# Patient Record
Sex: Female | Born: 1965
Health system: Southern US, Community
[De-identification: ages and names within clinical notes are randomized; demographics above are authoritative.]

## PROBLEM LIST (undated history)

## (undated) DIAGNOSIS — T8859XA Other complications of anesthesia, initial encounter: Secondary | ICD-10-CM

## (undated) DIAGNOSIS — I10 Essential (primary) hypertension: Secondary | ICD-10-CM

## (undated) DIAGNOSIS — G4730 Sleep apnea, unspecified: Secondary | ICD-10-CM

## (undated) DIAGNOSIS — F419 Anxiety disorder, unspecified: Secondary | ICD-10-CM

## (undated) DIAGNOSIS — F329 Major depressive disorder, single episode, unspecified: Secondary | ICD-10-CM

## (undated) DIAGNOSIS — Z9889 Other specified postprocedural states: Secondary | ICD-10-CM

## (undated) DIAGNOSIS — I499 Cardiac arrhythmia, unspecified: Secondary | ICD-10-CM

## (undated) DIAGNOSIS — M549 Dorsalgia, unspecified: Secondary | ICD-10-CM

## (undated) DIAGNOSIS — R7303 Prediabetes: Secondary | ICD-10-CM

## (undated) DIAGNOSIS — J069 Acute upper respiratory infection, unspecified: Secondary | ICD-10-CM

## (undated) DIAGNOSIS — Z9109 Other allergy status, other than to drugs and biological substances: Secondary | ICD-10-CM

## (undated) DIAGNOSIS — F32A Depression, unspecified: Secondary | ICD-10-CM

## (undated) HISTORY — PX: TUBAL LIGATION: SHX77

---
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
## 1997-11-16 ENCOUNTER — Other Ambulatory Visit: Admission: RE | Admit: 1997-11-16 | Discharge: 1997-11-16 | Payer: Self-pay | Admitting: Obstetrics and Gynecology

## 1999-03-20 ENCOUNTER — Other Ambulatory Visit: Admission: RE | Admit: 1999-03-20 | Discharge: 1999-03-20 | Payer: Self-pay | Admitting: Obstetrics and Gynecology

## 1999-10-16 ENCOUNTER — Inpatient Hospital Stay (HOSPITAL_COMMUNITY): Admission: AD | Admit: 1999-10-16 | Discharge: 1999-10-18 | Payer: Self-pay | Admitting: Obstetrics and Gynecology

## 1999-10-16 ENCOUNTER — Encounter (INDEPENDENT_AMBULATORY_CARE_PROVIDER_SITE_OTHER): Payer: Self-pay | Admitting: Specialist

## 1999-11-22 ENCOUNTER — Other Ambulatory Visit: Admission: RE | Admit: 1999-11-22 | Discharge: 1999-11-22 | Payer: Self-pay | Admitting: Obstetrics and Gynecology

## 2001-06-17 ENCOUNTER — Other Ambulatory Visit: Admission: RE | Admit: 2001-06-17 | Discharge: 2001-06-17 | Payer: Self-pay | Admitting: Family Medicine

## 2004-05-10 ENCOUNTER — Other Ambulatory Visit: Admission: RE | Admit: 2004-05-10 | Discharge: 2004-05-10 | Payer: Self-pay | Admitting: Family Medicine

## 2006-01-22 HISTORY — PX: CHOLECYSTECTOMY: SHX55

## 2006-06-02 ENCOUNTER — Inpatient Hospital Stay (HOSPITAL_COMMUNITY): Admission: EM | Admit: 2006-06-02 | Discharge: 2006-06-04 | Payer: Self-pay | Admitting: Emergency Medicine

## 2006-06-20 ENCOUNTER — Ambulatory Visit (HOSPITAL_COMMUNITY): Admission: RE | Admit: 2006-06-20 | Discharge: 2006-06-20 | Payer: Self-pay | Admitting: Family Medicine

## 2006-07-15 ENCOUNTER — Other Ambulatory Visit: Admission: RE | Admit: 2006-07-15 | Discharge: 2006-07-15 | Payer: Self-pay | Admitting: Family Medicine

## 2006-07-30 ENCOUNTER — Encounter (INDEPENDENT_AMBULATORY_CARE_PROVIDER_SITE_OTHER): Payer: Self-pay | Admitting: *Deleted

## 2006-07-30 ENCOUNTER — Ambulatory Visit (HOSPITAL_COMMUNITY): Admission: RE | Admit: 2006-07-30 | Discharge: 2006-07-30 | Payer: Self-pay | Admitting: *Deleted

## 2007-05-14 ENCOUNTER — Emergency Department (HOSPITAL_COMMUNITY): Admission: EM | Admit: 2007-05-14 | Discharge: 2007-05-14 | Payer: Self-pay | Admitting: Emergency Medicine

## 2007-12-09 ENCOUNTER — Observation Stay (HOSPITAL_COMMUNITY): Admission: EM | Admit: 2007-12-09 | Discharge: 2007-12-10 | Payer: Self-pay | Admitting: Emergency Medicine

## 2008-01-21 ENCOUNTER — Observation Stay (HOSPITAL_COMMUNITY): Admission: AD | Admit: 2008-01-21 | Discharge: 2008-01-22 | Payer: Self-pay | Admitting: Interventional Cardiology

## 2008-04-01 ENCOUNTER — Other Ambulatory Visit: Admission: RE | Admit: 2008-04-01 | Discharge: 2008-04-01 | Payer: Self-pay | Admitting: Family Medicine

## 2008-09-20 ENCOUNTER — Encounter: Admission: RE | Admit: 2008-09-20 | Discharge: 2008-09-20 | Payer: Self-pay | Admitting: Family Medicine

## 2008-09-23 ENCOUNTER — Encounter: Admission: RE | Admit: 2008-09-23 | Discharge: 2008-09-23 | Payer: Self-pay | Admitting: Family Medicine

## 2009-11-08 IMAGING — CT CT HEART WO/W CTA ONLY W/ CA
2 of 3 series · 11 of 20 positions shown, 12 images · IV contrast (APPLIED)
Comparison: none

CLINICAL DATA: CARDIAC CTA WITH CALCIUM SCORE 01/22/2008 [DATE]

Ordering Physician: Luckner
PROTOCOL: The patient scanned on a Siemens sensations 64 slice
scanner.  Gantry rotation speed was 320 milliseconds.  Collimation
was [DATE] mm . Reconstruction overlap was [DATE] mm.   50
mg of Lopressor was administered, as well as 0.4 mg of sublingual
nitroglycerin.  Average heart rate during the scan was 44 beats
per minute.  After an initial AP and lateral topogram, 3 mm axial
slices were performed through the heart for calcium scoring.  The
patient then received 20 ml of contrast for a timing bolus with a
region of interest in the ascending aorta.  A delay of 19 seconds
was used.  The patient then had a 80 ml of contrast given for
coronary CTA.  The 3-D data set was then sent to the [facility]
workstation.  Reconstructions were done using MIP,MPR and VRT
modes.

[Series 6: soft tissue · axial · 0.63mm/px · z∈[-250,-154]mm · 3 of 33 slices shown, 4 images]
[im 1/33  vessel]
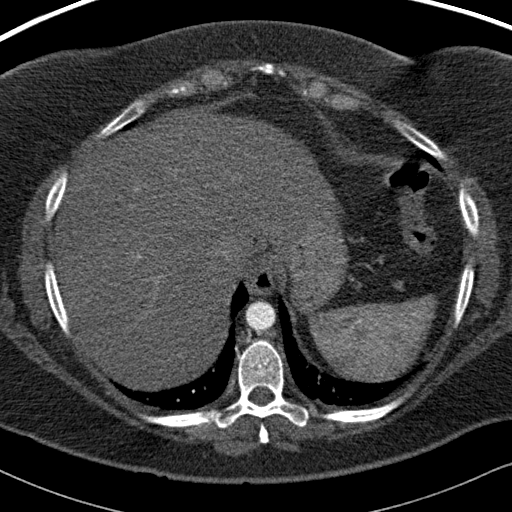
[im 1/33  lung]
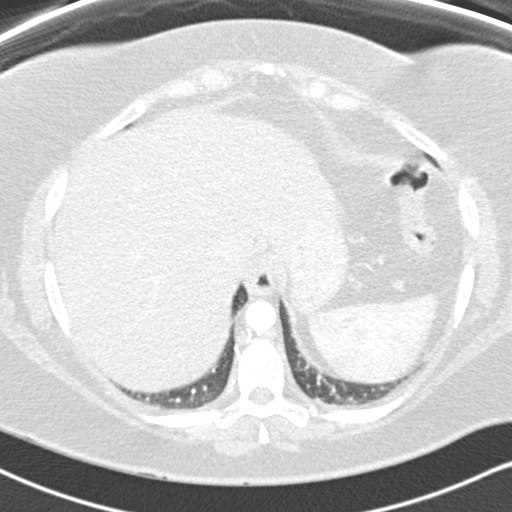
[im 17/33  vessel]
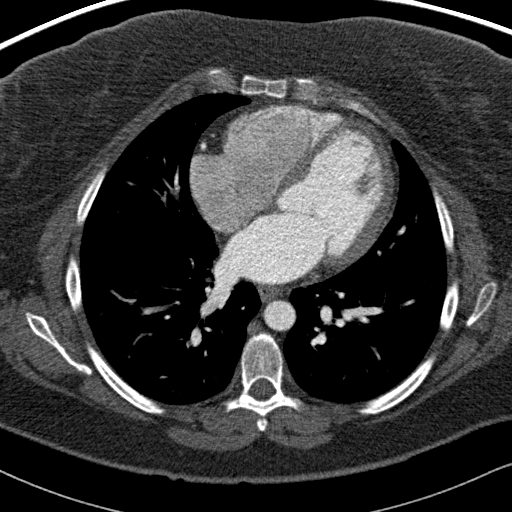
[im 33/33  vessel]
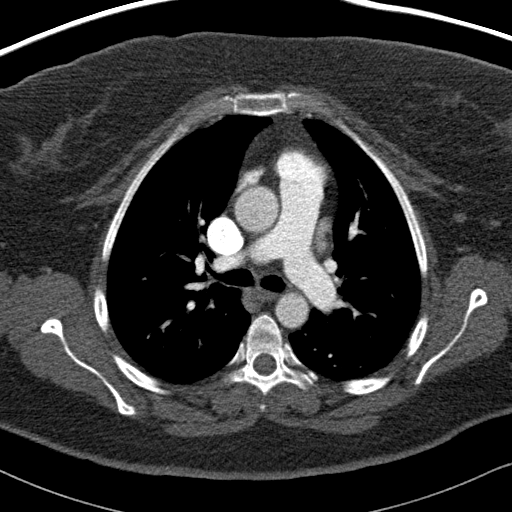

[Series 9: 70% only · axial · 0.37mm/px · z∈[-237,-166]mm · 8 of 213 slices shown]
[im 17/213  vessel]
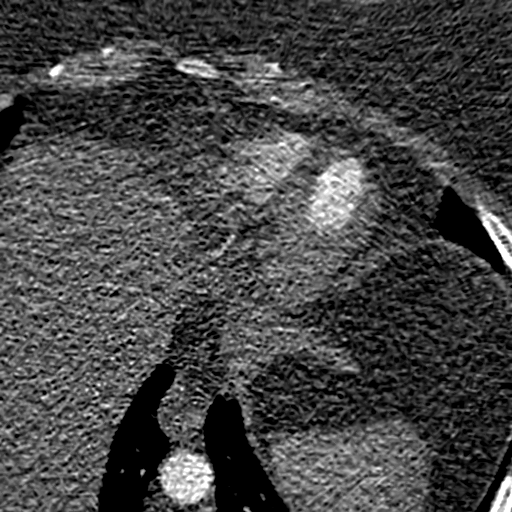
[im 49/213  vessel]
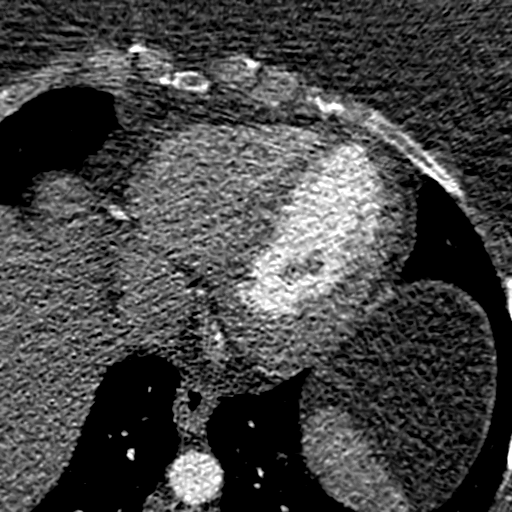
[im 66/213  vessel]
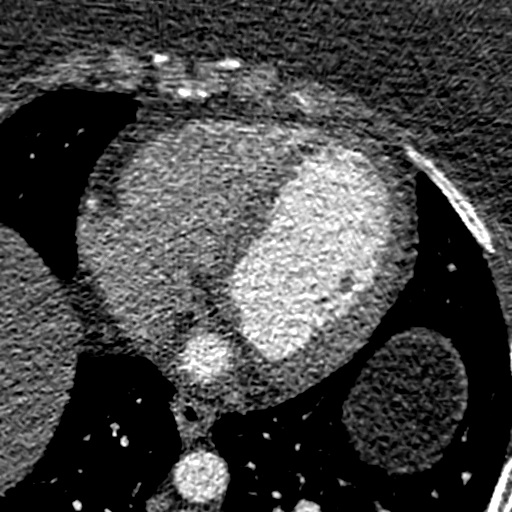
[im 98/213  vessel]
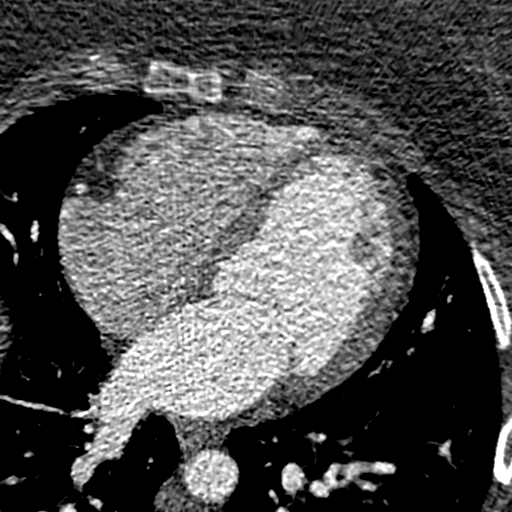
[im 115/213  vessel]
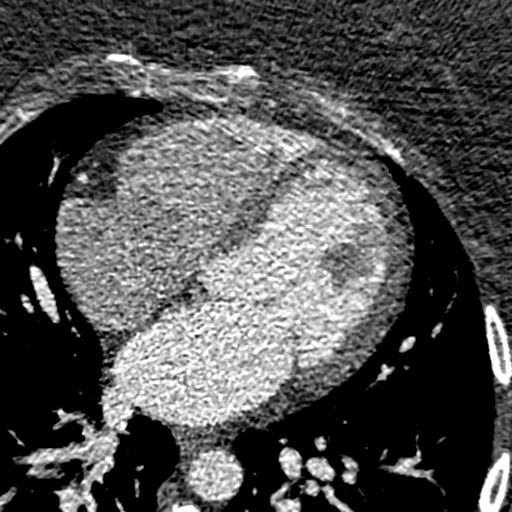
[im 147/213  vessel]
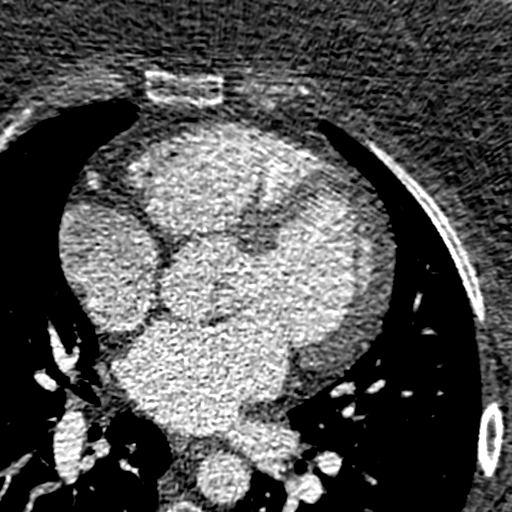
[im 164/213  vessel]
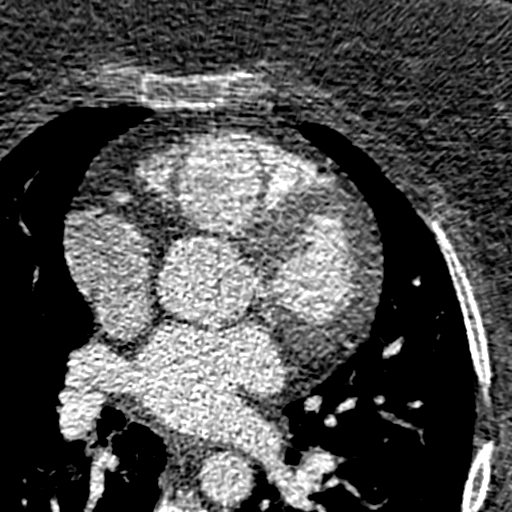
[im 196/213  vessel]
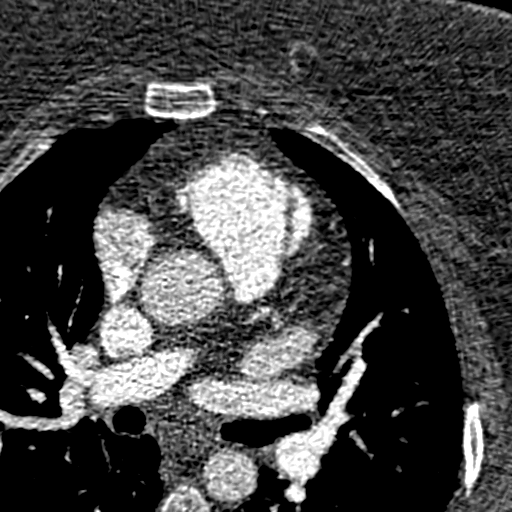

[11 of 20 positions shown; findings below may reference images not displayed]

Indications: Atypical chest pain, recurrent, previously low risk
nuclear stress test.  Risk factors include obesity.

DETAILED FINDINGS:

Quality of Study: Study is interpretable however mildly limited due
to body habitus.

Left Main: Gives rise to both the LAD and circumflex artery.  There
is no angiographically significant disease.

Left Anterior Descending: Gives rise to one large diagonal branch.
The distal segments of the artery are difficult to visualize. The
proximal diagonal segment is best seen in the 90% phase.  In the
segments visualized, there is no angiographically significant
calcification or plaquing.

Left Circumflex: There are two obtuse marginal branches identified.
The proximal portions of this vessel are widely patent without any
significant plaquing or stenosis.  The mid to distal sections of
the artery are difficult to visualize.

Right Coronary Artery: This is the dominant vessel giving rise to
the posterior descending artery.  This large caliber vessel has no
angiographically significant plaquing or stenosis.

Ventricular Function/Wall Motion: There are no wall motion
abnormalities.  Left ventricular septum measures 7 mm, posterior
wall measures 10 mm.

LV Ejection Fraction: Ejection fraction is 60-65%.

Left Atrium, Right Atrium, RV Size: The left atrium is mildly
dilated at 45 mm, the right atrium also appears mildly dilated.
The right ventricle is also mildly dilated in chamber size.  The
aortic valve appears trileaflet.  Is a opening/functioning
normally.  There is no evidence of coarctation.  There is no
evidence of aortic aneurysm in the segments visualized.  The aortic
root appears normal.

Pericardium: No pericardial effusion.

Coronary Calcium Score: Zero.  There is no coronary artery
calcification.

Aorta: Normal appearing aorta.

Other: Other noncardiac findings will be reviewed by over reading
radiologist.
IMPRESSION: 1. No angiographically significant coronary artery disease. The
proximal segment are well visualized, however the distal segments
of the LAD and circumflex artery are difficult to visualize most
likely secondary to body habitus.  Sensitivity of the study is
reduced.

 2. Normal left ventricular function with ejection fraction of  60-
65%.

3. Mildly dilated left atrium, right atrium, right ventricular
chamber size.

4. Calcium score is zero.

## 2010-06-06 NOTE — H&P (Signed)
NAME:  April Mccarty, April Mccarty NO.:  [id number]   MEDICAL RECORD NO.:  [id number]          PATIENT TYPE:  INP   LOCATION:  4733                         FACILITY:  [facility]   PHYSICIAN:  Corky Crafts, MDDATE OF BIRTH:  11-Sep-1965   DATE OF ADMISSION:  12/09/2007  DATE OF DISCHARGE:                              HISTORY & PHYSICAL   CHIEF COMPLAINT:  Chest pain.   HISTORY OF PRESENT ILLNESS:  April Mccarty is a 45 year old female with  known history of coronary artery disease.  She was admitted last year in  May 2008 with chest pain.  She had a 2-day stress test and at that time  this was normal.  She states that she had no further chest pain until  recently.  Recently, she has had intermittent substernal chest pain  radiating into her left arm and under her left breast.  Yesterday, she  stated she was lightheaded, but had no palpitations or chest pain.  She  then awoke last night with her heart racing and her heart squeezing.  She went back to sleep and awoke this morning and she was fine.  She  then went to work and developed a midsternal chest tightness with left  arm pain similar to previous pain.  While I was in the room with her,  she had a blood pressure taken and said that squeezing of the cuff made  her chest hurt more.  Initially, her chest pain was 10/10.  By the time  I saw her, her pain was 3/10 and this was without any treatment  whatsoever.  I did give her sublingual nitroglycerin at this point and  her pain subsided to a 0/10.   PAST MEDICAL HISTORY:  Obesity, seasonal allergies, and status post  bilateral tubal ligation.   SOCIAL HISTORY:  She lives in [place] with her husband.  She works as  a Scientist, physiological.  She has 2 children.  She denies any tobacco, alcohol,  or illicit drug use.   FAMILY HISTORY:  Mother is alive.  She has a heart valve problem.  Father's history is unknown.  She has a grandfather who died at age 72  from a myocardial  infarction.   ALLERGIES:  SULFA.   MEDICATIONS:  Zyrtec p.r.n.   REVIEW OF SYSTEMS:  Chest pain, palpitations, and nausea.  Review of  systems otherwise negative.   PHYSICAL EXAMINATION:  VITAL SIGNS:  Temperature 97.3, pulse 73, blood  pressure 136/91, respirations 18, and O2 saturations 100% on room air.  GENERAL:  She is in no acute distress, but is tearful.  HEENT:  Grossly normal.  No carotid or subclavian bruits.  No JVD or  thyromegaly.  Sclerae clear.  Conjunctivae normal.  Nares without  drainage.  CHEST:  Clear to auscultation bilaterally.  No reproducible chest pain.  HEART:  Regular rate and rhythm.  No gross murmur.  ABDOMEN:  Obese.  Good bowel sounds.  Nontender and nondistended.  No  mass.  No bruits.  EXTREMITIES:  No peripheral edema.  SKIN:  Warm and dry.  NEUROLOGIC:  Cranial nerves II-XII grossly intact.  PSYCH:  She is again  tearful and appears to be somewhat anxious.   Chest x-ray, mild peribronchial thickening.  Otherwise, no active  disease.  Lab studies show point of care markers negative x1, hemoglobin  12.8, hematocrit 39.1, sodium 140, potassium 4.2, BUN 16, creatinine  0.7.  EKG normal sinus rhythm, rate 63, within normal limits.  No ST  changes.   ASSESSMENT AND PLAN:  1. Chest pain, atypical.  2. Early family history of coronary artery disease.  3. Obesity.  4. Seasonal allergies.   We will cycle cardiac enzymes.  If her chest pain returns, consider a  cardiac catheterization tomorrow.  We will go ahead and place her on  Lovenox as well.  Serial EKGs.  If no recurrent chest pain, we will  discharge her to home and follow up in the office with a 2-day stress  test.  The patient was seen and examined by Dr. Everette Rank.      Guy Franco, P.A.      Corky Crafts, MD  Electronically Signed    LB/MEDQ  D:  12/09/2007  T:  12/10/2007  Job:  [id number]   cc:   Holley Bouche, M.D.  Corky Crafts, MD

## 2010-06-06 NOTE — Op Note (Signed)
NAME:  April Mccarty, April Mccarty NO.:  [id number]   MEDICAL RECORD NO.:  [id number]          PATIENT TYPE:  AMB   LOCATION:  DAY                          FACILITY:  [facility]   PHYSICIAN:  Alfonse Ras, MD   DATE OF BIRTH:  April 27, 1965   DATE OF PROCEDURE:  07/30/2006  DATE OF DISCHARGE:                               OPERATIVE REPORT   PREOPERATIVE DIAGNOSIS:  Symptomatic cholelithiasis.   POSTOPERATIVE DIAGNOSIS:  Symptomatic cholelithiasis.   PROCEDURE:  Laparoscopic cholecystectomy.   SURGEON:  Alfonse Ras, M.D.   ASSISTANT:  Leonie Man, M.D.   ANESTHESIA:  General.   DESCRIPTION:  The patient was taken to the operating room and placed in  the supine position. After adequate general anesthesia was induced using  an endotracheal tube, the abdomen was prepped and draped in a normal  sterile fashion. Using a transverse infraumbilical incision, I dissected  down to the fascia.  The fascia was opened vertically.  After entering  the peritoneum, an 0 Vicryl pursestring suture was placed around the  fascial defect.  Under direct vision, an 11 mm trocar was placed in the  subxiphoid region and two 5 mm trocars were placed in the right abdomen.  The gallbladder was identified and retracted cephalad.   Dissection at the neck of the gallbladder easily visualized the cystic  duct and a critical view was obtained.  This was a very small cystic  duct.  It was dissected and its junction with the gallbladder and common  duct were identified.  It was triply clipped and divided.  The cystic  artery was identified in a similar fashion, triply clipped and divided.  The gallbladder was taken off the gallbladder bed using Bovie  electrocautery and placed in an EndoCatch bag.  The right upper quadrant  was copiously irrigated.  Adequate hemostasis was ensured.  The  gallbladder was removed through the umbilical port in the EndoCatch bag.  The pneumoperitoneum was released.  The  infraumbilical fascial defect  was closed with the 0 Vicryl pursestring suture.  The skin incisions  were closed with subcuticular 4-0 Monocryl.  Steri-Strips and dressings  were applied.  The patient tolerated the procedure well and went to the  PACU in good condition.      Alfonse Ras, MD  Electronically Signed     KRE/MEDQ  D:  07/30/2006  T:  07/30/2006  Job:  [id number]

## 2010-06-06 NOTE — H&P (Signed)
NAME:  April Mccarty, April Mccarty NO.:  [id number]   MEDICAL RECORD NO.:  [id number]          PATIENT TYPE:  OBV   LOCATION:  2037                         FACILITY:  [facility]   PHYSICIAN:  Corky Crafts, MDDATE OF BIRTH:  [birth year]   DATE OF ADMISSION:  01/21/2008  DATE OF DISCHARGE:  01/22/2008                              HISTORY & PHYSICAL   REASON FOR ADMISSION:  Chest pain.   April Mccarty was seen back in the office on January 21, 2008,  complaining of chest pain, felt to be related to anxiety or a GI source.  She stated she had an abdominal ultrasound which was reportedly  normal.  She had been on Lexapro, as well as lorazepam and stated that  this helped initially, but she again started having chest pain.  She  described a throbbing discomfort under her left breast with associated  shortness of breath and nausea.  It was not related to exertion, meals,  or movement of the chest wall.   Dr. Eldridge Dace saw her in the office and felt that it was afer to keep her  in the hospital overnight to assure no cardiac abnormality.   PAST MEDICAL HISTORY:  1. Chest pain.  2. Obesity.  3. Allergies.  4. Asthma.  5. Gestational diabetes.  6. Tinea versicolor.  7. Status post tubal ligation.  8. Tonsillectomy.  9. Cholecystectomy.   FAMILY HISTORY:  Mom had diabetes, hypercholesterolemia, and stroke;  paternal grandfather, diabetes; maternal grandfather, hypertension;  maternal grandmother, hypertension.   SOCIAL HISTORY:  Quit smoking in 1995, denies alcohol.  She is married  and has 2 children.   ALLERGIES:  SULFACETAMIDE SODIUM.   CURRENT MEDICATIONS:  1. Zyrtec 10 mg 1 p.o. daily.  2. Sublingual nitroglycerin p.r.n. chest pain.  3. Lexapro 10 mg.  4. Lorazepam 0.5 mg 1 p.o. t.i.d. p.r.n.  5. Lotrisone cream apply to affected area twice a day.  6. Ativan p.r.n.  7. Omeprazole 20 mg one p.o. b.i.d.   PHYSICAL EXAMINATION:  VITAL SIGNS:  Weight 240, blood  pressure 170/90,  pulse 68.  GENERAL:  She is pleasant in no acute distress.  HEENT:  Normal.  JVD flat.  HEART:  Regular rate and rhythm.  Normal S1 and S2, no murmur, no rub,  no gallop, no clicks.  Heart murmur none.  LUNGS: Clear to auscultation.  No wheezing or rhonchi, or rales.  ABDOMEN: Soft, nontender, no hepatomegaly, no masses palpated.  EXTREMITIES:  No leg edema.  Peripheral pulses 2+ bilaterally.  NEUROLOGIC:  Cranial nerves intact.  MOOD:  Anxious when talking about her condition.   EKG shows normal sinus rhythm with no ST-T wave changes and essentially  has a normal EKG.   ASSESSMENT AND PLAN:  1. Chest pain.  2. Obesity.   Dr. Eldridge Dace had a long discussion with the patient and her husband  regarding the options of treating this chest pain.  He felt that it was  a low probability, this is cardiac in origin, but decided to admit the  patient to rule out myocardial infarction.  If her enzymes were  positive, we will plan for  cardiac catheterization; if they are  negative, plan CTA of the coronaries.      Guy Franco, P.A.      Corky Crafts, MD  Electronically Signed    LB/MEDQ  D:  01/22/2008  T:  01/23/2008  Job:  [id number]   cc:   Melida Quitter, M.D.

## 2010-06-06 NOTE — Discharge Summary (Signed)
NAME:  MARGARETANN, ABATE NO.:  [id number]   MEDICAL RECORD NO.:  [id number]          PATIENT TYPE:  INP   LOCATION:  3729                         FACILITY:  [facility]   PHYSICIAN:  Ellie Lunch, M.D.      DATE OF BIRTH:  [birth year]   DATE OF ADMISSION:  06/01/2006  DATE OF DISCHARGE:  06/04/2006                               DISCHARGE SUMMARY   PRIMARY CARE PHYSICIAN:  Dr. Leonides Sake.   CONSULTATIONS:  Dr. Eldridge Dace with [facility].   DISCHARGE DIAGNOSES:  1. Atypical chest pain.  The patient ruled out for an MI with serial      cardiac markers, as well as a negative Cardiolite.  2. Bradycardia.   PAST MEDICAL HISTORY:  1. Gestational diabetes.  2. Obesity.   MEDICATIONS:  Nitroglycerin 0.4 mg sublingual as needed for chest pain.   DISPOSITION AND FOLLOW UP:  The patient will follow up with Dr. Eldridge Dace  on June 26, 2006 at 1 p.m.  At that time, Dr. Eldridge Dace will also evaluate  her for bradycardia, which she had during hospital course especially  occurring at night, but the patient being asymptomatic from it.   PROCEDURES DONE:  A Cardiolite was performed while the patient was in  the hospital and was negative for left ventricle myocardial ischemia and  ejection fraction was 58%.   HISTORY OF PRESENT ILLNESS:  Ms. was a very pleasant 45 year old white  female with past medical history as indicated above who was admitted  because of a squeezing pain in her left chest associated with some  dizziness 1 day prior to admission while she was walking up a slight  hill.  She went back to the house, sat down, and the pain improved.  She  then went back to walking up the hill and the pain became more intense.  She felt like she had pain for about 12 hours continuously and went to  the walk in clinic so she could be admitted to the hospital to rule out  MI.  There was also severe shortness of breath, dizziness, and  clamminess.   HOSPITAL COURSE:  1. The patient  was admitted on a tele bed to rule her out for MI.  A      cardiology consult was obtained.  Dr. Eldridge Dace accessed the patient      .  Serial cardiac enzymes obtained were negative, also the patient      received a Cardiolite, which was negative as well.  Thus, the      patient most likely has atypical chest pain.  She will follow up      with Dr. Eldridge Dace as an outpatient on June 26, 2006.  2. Bradycardia.  The patient had an episode of bradycardia where her      heart rate would drop to 36 beats per minute and this would only      occur during night.  There was a question of whether this was      associated with obstructive sleep apnea, which can further be done      as an outpatient since the patient is obese  and does have      risk factors for obstructive sleep apnea.  Of note:  The patient      did no have any bradycardia during the day.  3. Obesity.  The patient was counseled on lifestyle management      including dietary advise and was asked to also do exercise along      with diet to lose weight.      Ellie Lunch, M.D.  Electronically Signed     BP/MEDQ  D:  06/04/2006  T:  06/04/2006  Job:  [id number]   cc:   Holley Bouche, M.D.  Corky Crafts, MD

## 2010-06-06 NOTE — H&P (Signed)
NAME:  April Mccarty, April Mccarty NO.:  [id number]   MEDICAL RECORD NO.:  [id number]          PATIENT TYPE:  INP   LOCATION:  3729                         FACILITY:  [facility]   PHYSICIAN:  Thora Lance, M.D.  DATE OF BIRTH:  January 19, 1966   DATE OF ADMISSION:  06/01/2006  DATE OF DISCHARGE:                              HISTORY & PHYSICAL   PRIMARY PHYSICIAN:  Dr. Leonides Sake.   CHIEF COMPLAINT:  Chest pain.   HISTORY OF PRESENT ILLNESS:  This is a 45 year old white female  generally in good health who this morning while walking her dog up a  slight hill had the onset of a squeezing pain in her left chest  associated with some dizziness.  She went back to the house, sat down,  and then the pain eased up.  She took some Gas-X and Pepcid.  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  During the  day she had mild discomfort in her left chest.  When she got home from  work she again walked her dog and again had pain which she described as  fairly severe in her left chest, squeezing, associated with shortness of  breath, dizziness, and some clamminess.  She got back to the house, sat  down, and the pain again eased off.  She came to the ER and was given  morphine, and the pain has resolved.  She has no known cardiac risk  factors.  Her maternal grandfather did have an MI at age 76.  She had  gestational diabetes.  She has not seen her primary physician in several  years and had lab work done.  Her cholesterol is unknown.   PAST MEDICAL HISTORY:  1. Gestational diabetes.  2. Obesity.   PAST SURGICAL HISTORY:  BTL.   ALLERGIES:  SULFA.   MEDICATIONS:  None.   FAMILY HISTORY:  Mother:  Diabetes, high cholesterol.  Father:  Unknown.  No siblings.  Maternal grandfather:  MI at age 44.  Maternal uncle:  Diabetes.  Paternal grandfather:  Diabetes.  Paternal uncle:  Diabetes.   SOCIAL HISTORY:  She is married.  She has two children.  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  She does not smoke or drink alcohol.   REVIEW OF SYSTEMS:  Intermittent reflux symptoms.   PHYSICAL EXAMINATION:  GENERAL:  An obese white female.  VITAL SIGNS:  Blood pressure 138/83, heart rate 69, respirations 22,  oxygen saturation 98% on room air, temperature 98.  HEENT:  Pupils equal and respond to light.  Anicteric.  Ears:  TMs  clear.  Oropharynx:  Clear.  NECK:  Supple, no bruits.  LUNGS:  Clear.  HEART:  Regular rate and rhythm.  No murmur, gallop, or rub.  Chest wall  shows minimal tenderness in the left upper chest.  ABDOMEN:  Soft, nontender, normal bowel sounds, no masses.  EXTREMITIES:  No edema.  NEUROLOGIC:  Nonfocal.   Laboratories available to me at this time are:  Sodium 138, potassium 4,  chloride 108, BUN 14, glucose 94, creatinine 0.8.  CK-MB less than 1.  Troponin I  less than 0.05.  EKG shows normal sinus rhythm, normal EKG.  Chest x-ray is pending.   ASSESSMENT:  Chest pain, rule out unstable angina pectoris.  Her  description of exertional chest squeezing with associated symptoms is  fairly classic for angina.  However, she is only 45 years old and her  only cardiac risk factor is history of an early myocardial infarction in  her grandfather.  I think it is unlikely but not out of the question  that this is coronary ischemia.  I think it should be ruled out before  she is discharged home.   PLAN:  1. Admit to telemetry.  2. Lovenox subcu.  3. Aspirin.  4. Nitroglycerin p.r.n.  5. Repeat cardiac enzymes in the morning.  6. Cardiology consultation.           ______________________________  Thora Lance, M.D.     JJG/MEDQ  D:  06/02/2006  T:  06/02/2006  Job:  [id number]   cc:   Holley Bouche, M.D.

## 2010-06-06 NOTE — Consult Note (Signed)
NAME:  April Mccarty, April Mccarty NO.:  [id number]   MEDICAL RECORD NO.:  [id number]          PATIENT TYPE:  INP   LOCATION:  3729                         FACILITY:  [facility]   PHYSICIAN:  Corky Crafts, MDDATE OF BIRTH:  [birth year]   DATE OF CONSULTATION:  06/02/2006  DATE OF DISCHARGE:                                 CONSULTATION   REFERRING PHYSICIAN:  Berton Mount, MD   PRIMARY CARE PHYSICIAN:  Dr. Leonides Sake   REASON FOR CONSULTATION:  Chest pain.   HISTORY OF PRESENT ILLNESS:  The patient is a 45 year old woman who had  gestational diabetes but currently has no other significant risk factors  for heart disease.  She noted a squeezing pain her left chest associated  with some dizziness yesterday while she was walking up a slight hill.  She went back to her house, sat down and the pain improved.  She went to  work and continued to have less intense pain during the day.  She then  went back to walking up the hill and the pain became more intense.  She  feels like she had pain for about 12 hours continuously.  She went to  the walk in clinic and was subsequently admitted to the hospital.  There  was associated shortness of breath, dizziness and clamminess.   PAST MEDICAL HISTORY:  1. Obesity.  2. Gestational diabetes.   PAST SURGICAL HISTORY:  Bilateral tubal ligation.   ALLERGIES:  SULFA.   MEDICATIONS:  Currently none.   FAMILY HISTORY:  Mother had diabetes and high cholesterol.  Her maternal  grandfather have had MI at age 41.  Diabetes runs on both sides of the  family.   SOCIAL HISTORY:  She is married.  She has two children.  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   REVIEW OF SYSTEMS:  Significant for chest pain and shortness of breath  as described above.  Occasional reflux cessation.  Currently no nausea  or vomiting.  All other systems negative.   PHYSICAL EXAM:  VITALS: Blood pressure is 128/77, pulse of  61,  respiratory rate of 18.  GENERAL:  Patient is awake, alert, no apparent stress.  HEAD:  Normocephalic, atraumatic.  EYES:  Extraocular is intact.  NECK:  No carotid bruits.  CARDIOVASCULAR:  Regular rate and rhythm, S1-S2.  LUNGS:  Clear to auscultation bilaterally.  ABDOMEN:  Mildly obese, nontender.  EXTREMITIES:  Showed no edema.  Palpable pedal pulses.  NEURO:  No focal deficits.  SKIN:  No rash.  BACK:  No kyphosis.  PSYCH:  Normal mood and affect.   EKG shows sinus bradycardia with no significant ST-T wave changes.  No  pathologic Q-waves.  Cardiac enzymes are negative.  INR 1.0, creatinine  0.73.   MEDICAL DECISION-MAKING:  A 45 year old with typical angina.   PLAN:  1. I will continue to rule out for enzymes, continue aspirin.  She has      not had any discomfort while at rest.  2. If she rules out and remains pain free, I would consider performing  outpatient stress test early this week.  3. After exercise test, I would like to try and recommend an exercise      regimen to help her lose weight.  4. Will try to obtain her cholesterol results, per her report her      cholesterol has been fine.      Corky Crafts, MD  Electronically Signed     JSV/MEDQ  D:  06/02/2006  T:  06/03/2006  Job:  [id number]

## 2010-06-06 NOTE — Discharge Summary (Signed)
NAME:  April Mccarty, April Mccarty NO.:  [id number]   MEDICAL RECORD NO.:  [id number]          PATIENT TYPE:  OBV   LOCATION:  2037                         FACILITY:  [facility]   PHYSICIAN:  Corky Crafts, MDDATE OF BIRTH:  1966/01/05   DATE OF ADMISSION:  01/21/2008  DATE OF DISCHARGE:  01/22/2008                               DISCHARGE SUMMARY   DISCHARGE DIAGNOSES:  1. Chest pain, resolved.  2. Obesity.  3. Anxiety.   HOSPITAL COURSE:  [name] is a 45 year old female who is admitted  to the [facility] on January 21, 2008, with substernal chest pain.  She did have some shortness of breath.  She stated that she has had an  abdominal ultrasound that is reported as being normal.  She has been on  the Lexapro as well as lorazepam and stated that this helped initially,  but then she again started having pain.  She was seen in our office by  Dr. Eldridge Dace who felt that it was impaired and that she should be  admitted to the hospital.   She was admitted to the hospital, ruled out by cardiac enzymes and it  was decided at that point to do a coronary CTA.  The study was  essentially normal and has showed no evidence of blockage and now the  patient is going home.   She will be discharged to home on her current home medication regimen,  which includes;  1. Zyrtec 10 mg 1 p.o. daily.  2. Lexapro 10 mg 1 p.o. daily.  3. Lorazepam 0.5 mg t.i.d. p.r.n.  4. Lotrisone 1 application to affected areas twice a day.  5. Ativan p.r.n.  6. Omeprazole 20 mg 1 p.o. b.i.d.   She is to remain on a low-fat, heart-healthy diet.   ACTIVITY:  As tolerated.  Call for any further questions or concerns.  She is to follow up with Dr. Tiburcio Pea for further workup can be performed  to see if there is any non-cardiac cause of her chest pain.      Guy Franco, P.A.      Corky Crafts, MD  Electronically Signed    LB/MEDQ  D:  01/22/2008  T:  01/23/2008  Job:  [id number]   cc:    Melida Quitter, M.D.  Corky Crafts, MD

## 2010-06-06 NOTE — Discharge Summary (Signed)
NAME:  April Mccarty, April Mccarty NO.:  [id number]   MEDICAL RECORD NO.:  [id number]          PATIENT TYPE:  INP   LOCATION:  4733                         FACILITY:  [facility]   PHYSICIAN:  Corky Crafts, MDDATE OF BIRTH:  07-12-1965   DATE OF ADMISSION:  12/09/2007  DATE OF DISCHARGE:  12/10/2007                               DISCHARGE SUMMARY   DISCHARGE DIAGNOSES:  1. Chest pain, felt to be noncardiac in nature.  2. Anxiety with early family history of coronary artery disease,      grandfather had MI at age 9.  3. Obesity.   ALLERGIES:  Seasonal.   HOSPITAL COURSE:  April Mccarty is a 45 year old female who has a  history of stress test back in May 2008, that was normal.  Over the past  several weeks, she has noted intermittent substernal chest pain  radiating into her left arm and into her left breast.  On the day prior  to admission, she was lightheaded, but had no palpitations.  She then  awoke on the early morning of December 09, 2007, with her heart racing  and wheezing.  She then went back to sleep and awoke feeling fine, but  then at work developed midsternal chest tightness and left arm pain  similar to previous pain.  Sublingual nitroglycerin made better.   She remained in the hospital overnight.  She did have some chest pain,  but her enzymes were negative x4.   Dr. Eldridge Dace had a long discussion with the patient regarding options.  The patient thinks, this is not cardiac related.  She apparently had  recent neck problems develop, then she thinks this may be causing the  pain.  She also admits to being under a lot of stress at work.  She has  2 jobs and a husband who is unemployed.  She tells [place] that she feels  well and would like to go back to work.   She had a cholecystectomy within the past year, but we will plan to  recheck an abdominal ultrasound to look for retained gallstones.  We  will have this scheduled before her follow up appointment.  If  she  continues to have discomfort, we may need to proceed with another 2-day  stress Cardiolite and if this is positive, then proceed with cardiac  catheterization.   DISCHARGE MEDICATIONS:  1. Zyrtec p.r.n.  2. Prilosec 20 mg a day.   Remain on low-sodium, heart-healthy diet.  Increase activity slowly.  The office will call with an appointment for an ultrasound of the  gallbladder and follow up with Dr. Eldridge Dace.  I have also given her  prescription for sublingual nitroglycerin to use as needed for chest  pain.      Guy Franco, P.A.      Corky Crafts, MD  Electronically Signed    LB/MEDQ  D:  12/10/2007  T:  12/10/2007  Job:  [id number]   cc:   Holley Bouche, M.D.

## 2010-06-09 NOTE — Op Note (Signed)
[facility]  Patient:    April Mccarty, April Mccarty               MRN: [id number] Proc. Date: 10/17/99 Adm. Date:  [id number] Attending:  Cordelia Pen Ii                           Operative Report  PREOPERATIVE DIAGNOSIS:       Multiparity, desires sterility.  POSTOPERATIVE DIAGNOSIS:      Multiparity, desires sterility.  OPERATION:                    Postpartum bilateral tubal ligation.  SURGEON:                      Juluis Mire, M.D.  ASSISTANT:  ANESTHESIA:                   General endotracheal anesthesia.  ESTIMATED BLOOD LOSS:         Minimal.  PACKS AND DRAINS:             None.  INTRAOPERATIVE BLOOD:         None.  COMPLICATIONS:                None.  INDICATIONS:                  A 45 year old gravida 2, now para 2, married white female presents for postpartum sterilization.  Alternative forms of birth control were discussed. The potential irreversibility of sterilization explained.  A failure rate of 1 in 200 was quoted. This can be in the form of ectopic pregnancy requiring further management.  The risks of surgery having been discussed, including the risk of anesthesia.  Risk of infection, risk of hemorrhage requiring transfusion with risk of AIDS or hepatitis.  The risk of injury to adjacent organs including bowel or bladder that could require further exploratory surgery.  Risk of deep venous thrombosis and pulmonary embolus.  DESCRIPTION OF PROCEDURE:     The patient was taken to the OR and placed in the supine position. After satisfactory level of general endotracheal anesthesia was obtained, the abdomen was prepped out with Betadine.  The bladder was emptied by catheterization.  The patient was then draped in a sterile field.  A subumbilical incision was made with a knife and carried through subcutaneous tissue.  The anterior rectus fascia was entered sharply and extended the fascia incision laterally.  Rectus  muscles were separated and the perineum was entered. There was no evidence of injury to adjacent organs since the uterus was just below the peritoneum.  First the left tube was identified and elevated through the incision. An avascular area was identified in the mesosalpinx.  A hole was made in the mesosalpinx using the Bovie. Individual ligatures of 0 plain catgut were used to ligate off a segment of tube.  Then the intervening segment of tube was then cut. The cut ends of the tubes were cauterized using the Bovie. We had good hemostasis and the left ovary looked and felt normal.  Next, the right tube was identified and elevated through the incision.  A hole was made in the avascular area of mesosalpinx using the Bovie.  Individual ligatures of 0 plain catgut were used to ligate off a segment of tube.  The intervening segment of tube was then cut and excised. The cut ends of the  tube were then cauterized using the Bovie. We had good hemostasis. The right ovary appeared to be normal.  Next, the fascia closed with running sutures of 0 Vicryl. Skin was closed with running subcuticular 4-0 Vicryl.  The patient, once extubated and alert, was transferred to the recovery room in good condition.  Sponge, needle and instrument counts reported as correct by the circulating nurse.  The patient tolerated the procedure well. DD:  10/17/99 TD:  10/18/99 Job: [id number]

## 2010-10-24 LAB — CBC
HCT: 35.8 — ABNORMAL LOW
HCT: 39.1
Hemoglobin: 11.8 — ABNORMAL LOW
MCHC: 32.7
MCHC: 33.1
MCV: 85.6
Platelets: 225
Platelets: 272
RDW: 15.3
RDW: 15.3
WBC: 7.4

## 2010-10-24 LAB — POCT I-STAT, CHEM 8
BUN: 16
Calcium, Ion: 1.15
HCT: 40
Hemoglobin: 13.6
Sodium: 140
TCO2: 27

## 2010-10-24 LAB — POCT CARDIAC MARKERS: Myoglobin, poc: 49.1

## 2010-10-24 LAB — CARDIAC PANEL(CRET KIN+CKTOT+MB+TROPI)
CK, MB: 0.5
Total CK: 34
Troponin I: 0.01

## 2010-10-24 LAB — LIPID PANEL
Cholesterol: 167
HDL: 52
LDL Cholesterol: 105 — ABNORMAL HIGH
Total CHOL/HDL Ratio: 3.2

## 2010-10-24 LAB — TROPONIN I: Troponin I: <0.01

## 2010-10-24 LAB — PROTIME-INR
INR: 1.1
Prothrombin Time: 14.1

## 2010-10-27 LAB — CBC
HCT: 40.1 % (ref 36.0–46.0)
Hemoglobin: 12.9 g/dL (ref 12.0–15.0)
MCHC: 32.3 g/dL (ref 30.0–36.0)
Platelets: 254 10*3/uL (ref 150–400)
RDW: 14.8 % (ref 11.5–15.5)

## 2010-10-27 LAB — CARDIAC PANEL(CRET KIN+CKTOT+MB+TROPI)
CK, MB: 0.9 ng/mL (ref 0.3–4.0)
CK, MB: 1.3 ng/mL (ref 0.3–4.0)
Relative Index: INVALID (ref 0.0–2.5)
Total CK: 116 U/L (ref 7–177)
Total CK: 61 U/L (ref 7–177)

## 2010-10-27 LAB — BASIC METABOLIC PANEL
BUN: 13 mg/dL (ref 6–23)
CO2: 29 mEq/L (ref 19–32)
GFR calc non Af Amer: >60 mL/min (ref >60)
Glucose, Bld: 91 mg/dL (ref 70–99)
Potassium: 6 mEq/L — ABNORMAL HIGH (ref 3.5–5.1)
Sodium: 139 mEq/L (ref 135–145)

## 2010-10-27 LAB — POTASSIUM: Potassium: 4.2 mEq/L (ref 3.5–5.1)

## 2010-11-07 LAB — HEMOGLOBIN AND HEMATOCRIT, BLOOD
HCT: 37.2
Hemoglobin: 12.4

## 2010-11-07 LAB — COMPREHENSIVE METABOLIC PANEL
Albumin: 3.3 — ABNORMAL LOW
BUN: 14
Calcium: 8.4
Creatinine, Ser: 0.63
Glucose, Bld: 108 — ABNORMAL HIGH
Potassium: 4.2
Total Protein: 6.5

## 2010-11-07 LAB — PREGNANCY, URINE: Preg Test, Ur: NEGATIVE

## 2011-05-22 ENCOUNTER — Encounter (HOSPITAL_COMMUNITY): Payer: Self-pay | Admitting: *Deleted

## 2011-05-22 ENCOUNTER — Emergency Department (HOSPITAL_COMMUNITY)
Admission: EM | Admit: 2011-05-22 | Discharge: 2011-05-23 | Disposition: A | Discharge status: Home / Self Care | Payer: BC Managed Care – PPO | Attending: Emergency Medicine | Admitting: Emergency Medicine

## 2011-05-22 DIAGNOSIS — X500XXA Overexertion from strenuous movement or load, initial encounter: Secondary | ICD-10-CM | POA: Insufficient documentation

## 2011-05-22 DIAGNOSIS — T148XXA Other injury of unspecified body region, initial encounter: Secondary | ICD-10-CM | POA: Insufficient documentation

## 2011-05-22 DIAGNOSIS — M549 Dorsalgia, unspecified: Secondary | ICD-10-CM | POA: Insufficient documentation

## 2011-05-22 HISTORY — DX: Dorsalgia, unspecified: M54.9

## 2011-05-22 HISTORY — DX: Other allergy status, other than to drugs and biological substances: Z91.09

## 2011-05-22 HISTORY — DX: Acute upper respiratory infection, unspecified: J06.9

## 2011-05-22 NOTE — ED Notes (Signed)
Pt c/o lower left back/hip pain since tonight when sat up from a table.  Hurts to move, hx of back pain but states she feels she "pulled something"

## 2011-05-23 ENCOUNTER — Encounter (HOSPITAL_COMMUNITY): Payer: Self-pay | Admitting: Emergency Medicine

## 2011-05-23 MED ORDER — DIAZEPAM 5 MG PO TABS
5.0000 mg | ORAL_TABLET | Freq: Once | ORAL | Status: AC
  Administered 2011-05-23: 5 mg via ORAL
  Filled 2011-05-23: qty 1

## 2011-05-23 MED ORDER — HYDROCODONE-ACETAMINOPHEN 5-325 MG PO TABS: ORAL_TABLET | ORAL | Status: AC

## 2011-05-23 MED ORDER — OXYCODONE-ACETAMINOPHEN 5-325 MG PO TABS
1.0000 | ORAL_TABLET | Freq: Once | ORAL | Status: AC
  Administered 2011-05-23: 1 via ORAL
  Filled 2011-05-23: qty 1

## 2011-05-23 NOTE — ED Provider Notes (Signed)
History     CSN: [id number]  Arrival date & time 05/22/11  2325   First MD Initiated Contact with Patient 05/22/11 2359      Chief Complaint  Patient presents with  . Back Pain     HPI Pt was seen at 2355.  Per pt, c/o gradual onset and persistence of constant acute flair of her chronic low back "pain" for the past several hours.  Pt states she was sitting at a table for several hours, stood up quickly, then began to have left sided LBP.  Denies any change in her usual chronic pain pattern.  Denies incont/retention of bowel or bladder, no saddle anesthesia, no focal motor weakness, no tingling/numbness in extremities, no fevers, no injury.   The symptoms have been associated with no other complaints. The patient has a significant history of similar symptoms previously.     Past Medical History  Diagnosis Date  . Back pain   . URI (upper respiratory infection)   . Asthma   . Environmental allergies     Past Surgical History  Procedure Date  . Cholecystectomy   . Tubal ligation      History  Substance Use Topics  . Smoking status: Never Smoker   . Smokeless tobacco: Not on file  . Alcohol Use: No    Review of Systems ROS: Statement: All systems negative except as marked or noted in the HPI; Constitutional: Negative for fever and chills. ; ; Eyes: Negative for eye pain, redness and discharge. ; ; ENMT: Negative for ear pain, hoarseness, nasal congestion, sinus pressure and sore throat. ; ; Cardiovascular: Negative for chest pain, palpitations, diaphoresis, dyspnea and peripheral edema. ; ; Respiratory: Negative for cough, wheezing and stridor. ; ; Gastrointestinal: Negative for nausea, vomiting, diarrhea, abdominal pain, blood in stool, hematemesis, jaundice and rectal bleeding. . ; ; Genitourinary: Negative for dysuria, flank pain and hematuria. ; ; Musculoskeletal: +LBP.  Negative for neck pain. Negative for swelling and trauma.; ; Skin: Negative for pruritus, rash,  abrasions, blisters, bruising and skin lesion.; ; Neuro: Negative for headache, lightheadedness and neck stiffness. Negative for weakness, altered level of consciousness , altered mental status, extremity weakness, paresthesias, involuntary movement, seizure and syncope.     Allergies  Sulfa antibiotics  Home Medications   Current Outpatient Rx  Name Route Sig Dispense Refill  . AZITHROMYCIN 250 MG PO TABS Oral Take 250 mg by mouth daily.    Marland Kitchen CETIRIZINE HCL 10 MG PO TABS Oral Take 10 mg by mouth daily.    Marland Kitchen GABAPENTIN 100 MG PO CAPS Oral Take 200 mg by mouth at bedtime.    Marland Kitchen PREDNISONE 50 MG PO TABS Oral Take 50 mg by mouth daily.      BP 155/100  Pulse 80  Temp(Src) 98.2 F (36.8 C) (Oral)  Resp 20  SpO2 96%  Physical Exam 0005: Physical examination:  Nursing notes reviewed; Vital signs and O2 SAT reviewed;  Constitutional: Well developed, Well nourished, Well hydrated, In no acute distress; Head:  Normocephalic, atraumatic; Eyes: EOMI, PERRL, No scleral icterus; ENMT: Mouth and pharynx normal, Mucous membranes moist; Neck: Supple, Full range of motion, No lymphadenopathy; Cardiovascular: Regular rate and rhythm, No murmur, rub, or gallop; Respiratory: Breath sounds clear & equal bilaterally, No rales, rhonchi, wheezes, or rub, Normal respiratory effort/excursion; Chest: Nontender, Movement normal; Abdomen: Soft, Nontender, Nondistended, Normal bowel sounds; Genitourinary: No CVA tenderness; Spine:  No midline CS, TS, LS tenderness. +TTP left lumbar paraspinal muscles.; Extremities: Pulses  normal, No tenderness, No edema, No calf edema or asymmetry.; Neuro: AA&Ox3, Major CN grossly intact. Strength 5/5 equal bilat UE's and LE's, including great toe dorsiflexion.  DTR 2/4 equal bilat UE's and LE's.  No gross sensory deficits.  Neg straight leg raises bilat.; Skin: Color normal, Warm, Dry, no rash.     ED Course  Procedures   MDM  MDM Reviewed: nursing note, vitals and previous  chart     12:13 AM:  Hx chronic LBP; stood up quickly after sitting for several hours, likely msk pain at this time and will tx symptomatically.  Has prednisone, neurontin and robaxin at home; will add pain med.  Pt cautioned not to take her ibuprofen while taking prednisone (s/e stomach upset).  Verb understanding.           Laray Anger, DO 05/24/11 1425

## 2011-05-23 NOTE — Discharge Instructions (Signed)
RESOURCE GUIDE  Dental Problems  Patients with Medicaid: [facility]                     [facility] [address]                                           [address] Phone:  [phone]                                                  Phone:  [phone]  If unable to pay or uninsured, contact:  [facility] or [facility] to become qualified for the adult dental clinic.  Chronic Pain Problems Contact [facility]  [phone] Patients need to be referred by their primary care doctor.  Insufficient Money for Medicine Contact [employer]:  call "211" or [facility] 271-5999.  No Primary Care Doctor Call [facility]  832-8000 Other agencies that provide inexpensive medical care    [facility]  832-8035    [facility]  832-7272    [facility]  [phone]    Women's Clinic  832-4777    [facility]  [phone]    [facility]  [phone]  Psychological Services [facility]  [phone] [facility]  [phone] [facility]   800 853-5163 (emergency services 641-4993)  Substance Abuse Resources [facility]  336-882-2125 [facility] 336-784-9470 [facility] 336-285-9073 [facility] 336-845-3988 Residential & Outpatient Substance Abuse Program  800-659-3381  Abuse/Neglect [place] Child Abuse Hotline (336) 641-3795 [place] Child Abuse Hotline 800-378-5315 (After Hours)  Emergency Shelter [facility] (336) 271-5985  Maternity Homes [facility] (336) 275-9566 [facility] (704) 372-4663  MRSA Hotline #:   832-7006    [place] Resources  [facility] [place]     [employer]                          [facility] [address] [place]                       [address]      [address]  [place]                                                [place]                            [place] Phone:  [phone]                                   Phone:  [phone]                 Phone:  [phone]  [facility] Phone:  [phone]    [facility] Child Abuse Hotline 604-206-1076 406-774-5512 (After Hours)   Take the prescription as directed.  Continue to take your prednisone, neurontin and robaxin.  Do not take ibuprofen while you are taking prednisone, as it may cause stomach upset.  Apply moist heat or ice to the area(s) of discomfort, for 15 minutes at a time, several times per day for the next few days.  Do not fall asleep on a heating or ice pack.  Call your regular medical doctor today to schedule a follow up appointment this week.  Return to the Emergency Department immediately if worsening.

## 2012-04-17 ENCOUNTER — Other Ambulatory Visit: Payer: Self-pay | Admitting: Family Medicine

## 2012-04-17 DIAGNOSIS — Z0000 Encounter for general adult medical examination without abnormal findings: Secondary | ICD-10-CM | POA: Insufficient documentation

## 2012-04-18 ENCOUNTER — Other Ambulatory Visit (HOSPITAL_COMMUNITY)
Admission: RE | Admit: 2012-04-18 | Discharge: 2012-04-18 | Disposition: A | Discharge status: Home / Self Care | Payer: BC Managed Care – PPO | Source: Ambulatory Visit | Attending: Family Medicine | Admitting: Family Medicine

## 2012-05-05 ENCOUNTER — Other Ambulatory Visit (HOSPITAL_COMMUNITY): Payer: Self-pay | Admitting: Family Medicine

## 2012-05-05 ENCOUNTER — Ambulatory Visit (HOSPITAL_COMMUNITY)
Admission: RE | Admit: 2012-05-05 | Discharge: 2012-05-05 | Disposition: A | Discharge status: Home / Self Care | Payer: BC Managed Care – PPO | Source: Ambulatory Visit | Attending: Family Medicine | Admitting: Family Medicine

## 2012-05-05 DIAGNOSIS — R1032 Left lower quadrant pain: Secondary | ICD-10-CM | POA: Insufficient documentation

## 2012-05-05 DIAGNOSIS — N281 Cyst of kidney, acquired: Secondary | ICD-10-CM | POA: Insufficient documentation

## 2012-05-05 DIAGNOSIS — K439 Ventral hernia without obstruction or gangrene: Secondary | ICD-10-CM | POA: Insufficient documentation

## 2012-05-05 DIAGNOSIS — Z9089 Acquired absence of other organs: Secondary | ICD-10-CM | POA: Insufficient documentation

## 2012-05-05 MED ORDER — IOHEXOL 300 MG/ML  SOLN: 50.0000 mL | Freq: Once | INTRAMUSCULAR | Status: AC | PRN

## 2012-05-05 MED ORDER — IOHEXOL 300 MG/ML  SOLN
100.0000 mL | Freq: Once | INTRAMUSCULAR | Status: AC | PRN
  Administered 2012-05-05: 100 mL via INTRAVENOUS

## 2012-05-07 ENCOUNTER — Ambulatory Visit (INDEPENDENT_AMBULATORY_CARE_PROVIDER_SITE_OTHER): Payer: BC Managed Care – PPO | Admitting: Surgery

## 2012-05-07 ENCOUNTER — Encounter (INDEPENDENT_AMBULATORY_CARE_PROVIDER_SITE_OTHER): Payer: Self-pay | Admitting: Surgery

## 2012-05-07 ENCOUNTER — Other Ambulatory Visit (INDEPENDENT_AMBULATORY_CARE_PROVIDER_SITE_OTHER): Payer: Self-pay | Admitting: Surgery

## 2012-05-07 VITALS — BP 132/74 | HR 60 | Temp 97.6°F | Resp 18 | Ht 63.0 in | Wt 262.0 lb

## 2012-05-07 DIAGNOSIS — K439 Ventral hernia without obstruction or gangrene: Secondary | ICD-10-CM | POA: Insufficient documentation

## 2012-05-07 NOTE — Progress Notes (Signed)
Re:   Jonna Dittrich Hagos DOB:   [birth year] MRN:   [id number]  ASSESSMENT AND PLAN: 1.  Ventral hernia (s)  I discussed the indications and complications of hernia surgery with the patient.  I discussed both the laparoscopic and open approach to hernia repair..  The potential risks of hernia surgery include, but are not limited to, bleeding, infection, open surgery, nerve injury, and recurrence of the hernia.    I think that she is a candidate for laparoscopic repair.  I provided the patient literature about hernia surgery.  2. Back pain 3.  Asthma - bronchitis induced 4.  Hypertension 5.  OSA - CPAP 6.  Depression/anxiety 7.  Morbid obesity  We talked about her weight and how it increases the risk of recurrence of her hernia.  Also is affects her sleep apnea and back pain.  Chief Complaint  Patient presents with  . New Evaluation   REFERRING PHYSICIAN: Johny Blamer, MD  HISTORY OF PRESENT ILLNESS: AVALEEN BROWNLEY is a 47 y.o. (DOB: 08/06/1965)  white  female whose primary care physician is Johny Blamer, MD and comes to me today for abdominal pain/ventral hernias.  She is accompanied by her mother.  The patient has had a tubal ligation in 2005 and a laparoscopic cholecystectomy in 2008. She has no chronic abdominal symptoms or gastrointestinal complaints. She had fairly onset sudden upper abdominal pain located to the left lower abdomen on Monday, April 14. She had nausea early with this pain. She had a little change in her bowel habits.  She underwent a CT scan on 05/05/2012 this showed 2 anterior abdominal wall hernias and no evidence of diverticulitis.  She has had no fever or chills.  Her weight is stable.  CT scan - 05/05/2012 - "Two adjacent anterior abdominal wall hernias below the umbilicus containing only fat. There may be some inflammatory change in the area and this could possibly be symptomatic."    Past Medical History  Diagnosis Date  . Back pain   . URI  (upper respiratory infection)   . Asthma   . Environmental allergies       Past Surgical History  Procedure Laterality Date  . Tubal ligation    . Cholecystectomy  2008  . Tonsillectomy  1984      Current Outpatient Prescriptions  Medication Sig Dispense Refill  . cetirizine (ZYRTEC) 10 MG tablet Take 10 mg by mouth daily.      Marland Kitchen escitalopram (LEXAPRO) 10 MG tablet Take 10 mg by mouth daily.      . fluticasone (VERAMYST) 27.5 MCG/SPRAY nasal spray Place 2 sprays into the nose daily.      Marland Kitchen gabapentin (NEURONTIN) 100 MG capsule Take 200 mg by mouth at bedtime.      . hydrochlorothiazide (HYDRODIURIL) 25 MG tablet Take 25 mg by mouth daily.      Marland Kitchen HYDROcodone-acetaminophen (NORCO/VICODIN) 5-325 MG per tablet Take 1 tablet by mouth every 6 (six) hours as needed for pain.      Marland Kitchen ibuprofen (ADVIL,MOTRIN) 800 MG tablet Take 800 mg by mouth every 8 (eight) hours as needed for pain.      Marland Kitchen lisinopril (PRINIVIL,ZESTRIL) 10 MG tablet Take 10 mg by mouth daily.      Marland Kitchen azithromycin (ZITHROMAX) 250 MG tablet Take 250 mg by mouth daily.      . predniSONE (DELTASONE) 50 MG tablet Take 50 mg by mouth daily.       No current facility-administered medications for this  visit.      Allergies  Allergen Reactions  . Sulfa Antibiotics Other (See Comments)    unknown  . Tessalon (Benzonatate) Cough    REVIEW OF SYSTEMS: Skin:  No history of rash.  No history of abnormal moles. Infection:  No history of hepatitis or HIV.  No history of MRSA. Neurologic:  No history of stroke.  No history of seizure.  No history of headaches. Cardiac:  Hypertension x 4 months. Pulmonary:   Gets asthma when she gets bronchitis. OSA/CPAP x 4 months.  Endocrine:  No diabetes. No thyroid disease. Gastrointestinal:  No history of stomach disease.  No history of liver disease.  Lap chole - 2008 - Dr. Colin Benton.  No history of pancreas disease.  No history of colon disease. Urologic:  No history of kidney stones.  No  history of bladder infections. GYN:  TL - Dr. Lacie Draft - 2005 Musculoskeletal:  Chronic back pain.  She tried PT, but this seemed to make it worse.  She is not seeing a physician for this at this time. Hematologic:  No bleeding disorder.  No history of anemia.  Not anticoagulated. Psycho-social:  The patient is oriented.   The patient has no obvious psychologic or social impairment to understanding our conversation and plan.  SOCIAL and FAMILY HISTORY: Married. Mother with patient.  Note - Mother had open ventral hernia surgery by Dr. Abbey Chatters after diverticulitis/colon surgery.  So they are familiar with the differential diagnosis. Has 2 daughters, 37 and 27. 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  PHYSICAL EXAM: BP 132/74  Pulse 60  Temp(Src) 97.6 F (36.4 C)  Resp 18  Ht 5\' 3"  (1.6 m)  Wt 262 lb (118.842 kg)  BMI 46.42 kg/m2  LMP 04/28/2012  General: Obese WF who is alert and generally healthy appearing.  HEENT: Normal. Pupils equal. Neck: Supple. No mass.  No thyroid mass. Lymph Nodes:  No supraclavicular or cervical nodes. Lungs: Clear to auscultation and symmetric breath sounds. Heart:  RRR. No murmur or rub. Abdomen: Soft. No mass. No hernia. Normal bowel sounds.  Mildly tender infraumbilical and LLQ.  No peritoneal sxes.  She is so obese, I can not feel these hernias. Rectal:  No mass, guaiac neg. Extremities:  Good strength and ROM  in upper and lower extremities. Neurologic:  Grossly intact to motor and sensory function. Psychiatric: Has normal mood and affect. Behavior is normal.   DATA REVIEWED: CT scan - 05/05/2012 - "Two adjacent anterior abdominal wall hernias below the umbilicus containing only fat. There may be some inflammatory change in the area and this could possibly be symptomatic."  Ovidio Kin, MD,  [facility], [place] [address] [place]  [address]   [place], [place]    [address] Phone:  [phone] FAX:   [phone]

## 2012-05-08 ENCOUNTER — Encounter (HOSPITAL_COMMUNITY): Payer: Self-pay | Admitting: Pharmacy Technician

## 2012-05-14 NOTE — Patient Instructions (Signed)
April Mccarty  05/14/2012   Your procedure is scheduled on:  05/20/12   Report to [facility] at   0530  AM.  Call this number if you have problems the morning of surgery: [phone]   Remember:   Do not eat food or drink liquids after midnight.   Take these medicines the morning of surgery with A SIP OF WATER:    Do not wear jewelry, make-up or nail polish.  Do not wear lotions, powders, or perfumes.   Do not shave 48 hours prior to surgery.   Do not bring valuables to the hospital.  Contacts, dentures or bridgework may not be worn into surgery.  Leave suitcase in the car. After surgery it may be brought to your room.  For patients admitted to the hospital, checkout time is 11:00 AM the day of  discharge.    SEE CHG INSTRUCTION SHEET    Please read over the following fact sheets that you were given: MRSA Information, coughing and deep breathing exercises, leg exercises               Failure to comply with these instructions may result in cancellation of your surgery.                Patient Signature ____________________________              Nurse Signature _____________________________

## 2012-05-15 ENCOUNTER — Encounter (HOSPITAL_COMMUNITY): Payer: Self-pay

## 2012-05-15 ENCOUNTER — Encounter (HOSPITAL_COMMUNITY)
Admission: RE | Admit: 2012-05-15 | Discharge: 2012-05-15 | Disposition: A | Discharge status: Home / Self Care | Payer: BC Managed Care – PPO | Source: Ambulatory Visit | Attending: Surgery | Admitting: Surgery

## 2012-05-15 DIAGNOSIS — I498 Other specified cardiac arrhythmias: Secondary | ICD-10-CM | POA: Insufficient documentation

## 2012-05-15 DIAGNOSIS — K439 Ventral hernia without obstruction or gangrene: Secondary | ICD-10-CM | POA: Insufficient documentation

## 2012-05-15 DIAGNOSIS — Z01812 Encounter for preprocedural laboratory examination: Secondary | ICD-10-CM | POA: Insufficient documentation

## 2012-05-15 DIAGNOSIS — Z01810 Encounter for preprocedural cardiovascular examination: Secondary | ICD-10-CM | POA: Insufficient documentation

## 2012-05-15 HISTORY — DX: Essential (primary) hypertension: I10

## 2012-05-15 HISTORY — DX: Sleep apnea, unspecified: G47.30

## 2012-05-15 HISTORY — DX: Anxiety disorder, unspecified: F41.9

## 2012-05-15 HISTORY — DX: Major depressive disorder, single episode, unspecified: F32.9

## 2012-05-15 HISTORY — DX: Depression, unspecified: F32.A

## 2012-05-15 LAB — CBC WITH DIFFERENTIAL/PLATELET
Basophils Absolute: 0 10*3/uL (ref 0.0–0.1)
HCT: 40.2 % (ref 36.0–46.0)
Lymphocytes Relative: 40 % (ref 12–46)
Lymphs Abs: 2.6 10*3/uL (ref 0.7–4.0)
MCV: 85.9 fL (ref 78.0–100.0)
Monocytes Absolute: 0.3 10*3/uL (ref 0.1–1.0)
Neutro Abs: 3.3 10*3/uL (ref 1.7–7.7)
RBC: 4.68 MIL/uL (ref 3.87–5.11)
RDW: 15.3 % (ref 11.5–15.5)
WBC: 6.4 10*3/uL (ref 4.0–10.5)

## 2012-05-15 LAB — COMPREHENSIVE METABOLIC PANEL WITH GFR
ALT: 22 U/L (ref 0–35)
AST: 21 U/L (ref 0–37)
Albumin: 3.5 g/dL (ref 3.5–5.2)
Alkaline Phosphatase: 67 U/L (ref 39–117)
BUN: 17 mg/dL (ref 6–23)
CO2: 31 meq/L (ref 19–32)
Calcium: 9.4 mg/dL (ref 8.4–10.5)
Chloride: 102 meq/L (ref 96–112)
Creatinine, Ser: 0.61 mg/dL (ref 0.50–1.10)
GFR calc Af Amer: >90 mL/min
GFR calc non Af Amer: >90 mL/min
Glucose, Bld: 96 mg/dL (ref 70–99)
Potassium: 4.2 meq/L (ref 3.5–5.1)
Sodium: 140 meq/L (ref 135–145)
Total Bilirubin: 0.2 mg/dL — ABNORMAL LOW (ref 0.3–1.2)
Total Protein: 7.3 g/dL (ref 6.0–8.3)

## 2012-05-15 LAB — SURGICAL PCR SCREEN
MRSA, PCR: NEGATIVE
Staphylococcus aureus: NEGATIVE

## 2012-05-19 NOTE — Anesthesia Preprocedure Evaluation (Addendum)
Anesthesia Evaluation  Patient identified by MRN, date of birth, ID band Patient awake    Reviewed: Allergy & Precautions, H&P , NPO status , Patient's Chart, lab work & pertinent test results, reviewed documented beta blocker date and time   Airway Mallampati: III TM Distance: >3 FB Neck ROM: full    Dental no notable dental hx. (+) Teeth Intact and Dental Advisory Given   Pulmonary asthma , sleep apnea and Continuous Positive Airway Pressure Ventilation ,  breath sounds clear to auscultation  Pulmonary exam normal       Cardiovascular hypertension, Pt. on medications Rhythm:regular Rate:Normal     Neuro/Psych negative neurological ROS  negative psych ROS   GI/Hepatic negative GI ROS, Neg liver ROS,   Endo/Other  negative endocrine ROSMorbid obesity  Renal/GU negative Renal ROS  negative genitourinary   Musculoskeletal   Abdominal Normal abdominal exam  (+) + obese,   Peds  Hematology negative hematology ROS (+)   Anesthesia Other Findings   Reproductive/Obstetrics negative OB ROS                         Anesthesia Physical Anesthesia Plan  ASA: III  Anesthesia Plan: General   Post-op Pain Management:    Induction: Intravenous  Airway Management Planned: Oral ETT  Additional Equipment:   Intra-op Plan:   Post-operative Plan: Extubation in OR  Informed Consent: I have reviewed the patients History and Physical, chart, labs and discussed the procedure including the risks, benefits and alternatives for the proposed anesthesia with the patient or authorized representative who has indicated his/her understanding and acceptance.   Dental Advisory Given  Plan Discussed with: CRNA and Surgeon  Anesthesia Plan Comments:         Anesthesia Quick Evaluation

## 2012-05-20 ENCOUNTER — Inpatient Hospital Stay (HOSPITAL_COMMUNITY): Payer: BC Managed Care – PPO | Admitting: Anesthesiology

## 2012-05-20 ENCOUNTER — Encounter (HOSPITAL_COMMUNITY): Payer: Self-pay | Admitting: *Deleted

## 2012-05-20 ENCOUNTER — Encounter (HOSPITAL_COMMUNITY)
Admission: RE | Disposition: A | Discharge status: Home / Self Care | Payer: Self-pay | Source: Ambulatory Visit | Attending: Surgery

## 2012-05-20 ENCOUNTER — Inpatient Hospital Stay (HOSPITAL_COMMUNITY)
Admission: RE | Admit: 2012-05-20 | Discharge: 2012-05-23 | DRG: 160 | Disposition: A | Discharge status: Home / Self Care | Payer: BC Managed Care – PPO | Source: Ambulatory Visit | Attending: Surgery | Admitting: Surgery

## 2012-05-20 ENCOUNTER — Encounter (HOSPITAL_COMMUNITY): Payer: Self-pay | Admitting: Anesthesiology

## 2012-05-20 DIAGNOSIS — G4733 Obstructive sleep apnea (adult) (pediatric): Secondary | ICD-10-CM | POA: Diagnosis present

## 2012-05-20 DIAGNOSIS — K436 Other and unspecified ventral hernia with obstruction, without gangrene: Secondary | ICD-10-CM

## 2012-05-20 DIAGNOSIS — K439 Ventral hernia without obstruction or gangrene: Secondary | ICD-10-CM

## 2012-05-20 DIAGNOSIS — F411 Generalized anxiety disorder: Secondary | ICD-10-CM | POA: Diagnosis present

## 2012-05-20 DIAGNOSIS — Z6841 Body Mass Index (BMI) 40.0 and over, adult: Secondary | ICD-10-CM

## 2012-05-20 DIAGNOSIS — I10 Essential (primary) hypertension: Secondary | ICD-10-CM | POA: Diagnosis present

## 2012-05-20 DIAGNOSIS — K430 Incisional hernia with obstruction, without gangrene: Principal | ICD-10-CM | POA: Diagnosis present

## 2012-05-20 DIAGNOSIS — F3289 Other specified depressive episodes: Secondary | ICD-10-CM | POA: Diagnosis present

## 2012-05-20 DIAGNOSIS — J45909 Unspecified asthma, uncomplicated: Secondary | ICD-10-CM | POA: Diagnosis present

## 2012-05-20 DIAGNOSIS — F329 Major depressive disorder, single episode, unspecified: Secondary | ICD-10-CM | POA: Diagnosis present

## 2012-05-20 HISTORY — PX: VENTRAL HERNIA REPAIR: SHX424

## 2012-05-20 SURGERY — REPAIR, HERNIA, VENTRAL, LAPAROSCOPIC
Anesthesia: General | Site: Abdomen | Wound class: Clean

## 2012-05-20 MED ORDER — BUPIVACAINE HCL (PF) 0.25 % IJ SOLN
INTRAMUSCULAR | Status: DC | PRN
  Administered 2012-05-20: 20 mL

## 2012-05-20 MED ORDER — DEXTROSE 5 % IV SOLN
3.0000 g | INTRAVENOUS | Status: AC
  Administered 2012-05-20: 3 g via INTRAVENOUS
  Filled 2012-05-20: qty 3000

## 2012-05-20 MED ORDER — HEPARIN SODIUM (PORCINE) 5000 UNIT/ML IJ SOLN
5000.0000 [IU] | Freq: Three times a day (TID) | INTRAMUSCULAR | Status: DC
  Administered 2012-05-20 – 2012-05-23 (×8): 5000 [IU] via SUBCUTANEOUS
  Filled 2012-05-20 (×12): qty 1

## 2012-05-20 MED ORDER — LACTATED RINGERS IV SOLN: INTRAVENOUS | Status: DC

## 2012-05-20 MED ORDER — FLUTICASONE PROPIONATE 50 MCG/ACT NA SUSP
2.0000 | Freq: Every day | NASAL | Status: DC
  Administered 2012-05-22: 2 via NASAL
  Filled 2012-05-20: qty 16

## 2012-05-20 MED ORDER — ACETAMINOPHEN 10 MG/ML IV SOLN
1000.0000 mg | Freq: Four times a day (QID) | INTRAVENOUS | Status: AC
  Administered 2012-05-20 – 2012-05-21 (×4): 1000 mg via INTRAVENOUS
  Filled 2012-05-20 (×6): qty 100

## 2012-05-20 MED ORDER — ONDANSETRON HCL 4 MG/2ML IJ SOLN
INTRAMUSCULAR | Status: DC | PRN
  Administered 2012-05-20: 4 mg via INTRAVENOUS

## 2012-05-20 MED ORDER — LISINOPRIL 10 MG PO TABS
10.0000 mg | ORAL_TABLET | Freq: Every day | ORAL | Status: DC
  Administered 2012-05-21 – 2012-05-23 (×3): 10 mg via ORAL
  Filled 2012-05-20 (×4): qty 1

## 2012-05-20 MED ORDER — HYDROMORPHONE HCL PF 1 MG/ML IJ SOLN
0.2500 mg | INTRAMUSCULAR | Status: DC | PRN
  Administered 2012-05-20: 0.5 mg via INTRAVENOUS

## 2012-05-20 MED ORDER — ONDANSETRON HCL 4 MG/2ML IJ SOLN
4.0000 mg | Freq: Four times a day (QID) | INTRAMUSCULAR | Status: DC | PRN
  Administered 2012-05-20: 4 mg via INTRAVENOUS
  Filled 2012-05-20: qty 2

## 2012-05-20 MED ORDER — ESCITALOPRAM OXALATE 10 MG PO TABS
10.0000 mg | ORAL_TABLET | Freq: Every evening | ORAL | Status: DC
  Administered 2012-05-20 – 2012-05-22 (×3): 10 mg via ORAL
  Filled 2012-05-20 (×4): qty 1

## 2012-05-20 MED ORDER — SUCCINYLCHOLINE CHLORIDE 20 MG/ML IJ SOLN
INTRAMUSCULAR | Status: DC | PRN
  Administered 2012-05-20: 100 mg via INTRAVENOUS

## 2012-05-20 MED ORDER — GABAPENTIN 100 MG PO CAPS
200.0000 mg | ORAL_CAPSULE | Freq: Every day | ORAL | Status: DC
  Administered 2012-05-20 – 2012-05-22 (×3): 200 mg via ORAL
  Filled 2012-05-20 (×4): qty 2

## 2012-05-20 MED ORDER — HYDROCHLOROTHIAZIDE 25 MG PO TABS
25.0000 mg | ORAL_TABLET | Freq: Every day | ORAL | Status: DC
  Administered 2012-05-21 – 2012-05-23 (×3): 25 mg via ORAL
  Filled 2012-05-20 (×4): qty 1

## 2012-05-20 MED ORDER — LORATADINE 10 MG PO TABS
10.0000 mg | ORAL_TABLET | Freq: Every day | ORAL | Status: DC
  Administered 2012-05-22: 10 mg via ORAL
  Filled 2012-05-20 (×4): qty 1

## 2012-05-20 MED ORDER — FENTANYL CITRATE 0.05 MG/ML IJ SOLN
INTRAMUSCULAR | Status: DC | PRN
  Administered 2012-05-20: 50 ug via INTRAVENOUS
  Administered 2012-05-20: 100 ug via INTRAVENOUS

## 2012-05-20 MED ORDER — LACTATED RINGERS IV SOLN
INTRAVENOUS | Status: DC | PRN
  Administered 2012-05-20 (×2): via INTRAVENOUS

## 2012-05-20 MED ORDER — ONDANSETRON HCL 4 MG PO TABS: 4.0000 mg | ORAL_TABLET | Freq: Four times a day (QID) | ORAL | Status: DC | PRN

## 2012-05-20 MED ORDER — DEXAMETHASONE SODIUM PHOSPHATE 4 MG/ML IJ SOLN
INTRAMUSCULAR | Status: DC | PRN
  Administered 2012-05-20: 4 mg via INTRAVENOUS

## 2012-05-20 MED ORDER — CHLORHEXIDINE GLUCONATE 4 % EX LIQD: 1.0000 "application " | Freq: Once | CUTANEOUS | Status: DC

## 2012-05-20 MED ORDER — HYDROCODONE-ACETAMINOPHEN 5-325 MG PO TABS
1.0000 | ORAL_TABLET | ORAL | Status: DC | PRN
  Administered 2012-05-22 – 2012-05-23 (×5): 1 via ORAL
  Filled 2012-05-20 (×6): qty 1

## 2012-05-20 MED ORDER — LIDOCAINE HCL (CARDIAC) 20 MG/ML IV SOLN
INTRAVENOUS | Status: DC | PRN
  Administered 2012-05-20: 60 mg via INTRAVENOUS

## 2012-05-20 MED ORDER — ALPRAZOLAM 0.25 MG PO TABS
0.2500 mg | ORAL_TABLET | Freq: Three times a day (TID) | ORAL | Status: DC | PRN
  Administered 2012-05-20: 0.25 mg via ORAL
  Filled 2012-05-20: qty 1

## 2012-05-20 MED ORDER — PROPOFOL 10 MG/ML IV BOLUS
INTRAVENOUS | Status: DC | PRN
  Administered 2012-05-20: 200 mg via INTRAVENOUS

## 2012-05-20 MED ORDER — POTASSIUM CHLORIDE IN NACL 20-0.45 MEQ/L-% IV SOLN
INTRAVENOUS | Status: DC
  Administered 2012-05-20: 13:00:00 via INTRAVENOUS
  Administered 2012-05-20: 100 mL via INTRAVENOUS
  Administered 2012-05-21 – 2012-05-22 (×3): via INTRAVENOUS
  Filled 2012-05-20 (×8): qty 1000

## 2012-05-20 MED ORDER — PROMETHAZINE HCL 25 MG/ML IJ SOLN
6.2500 mg | INTRAMUSCULAR | Status: DC | PRN
  Administered 2012-05-20: 6.25 mg via INTRAVENOUS

## 2012-05-20 MED ORDER — NEOSTIGMINE METHYLSULFATE 1 MG/ML IJ SOLN
INTRAMUSCULAR | Status: DC | PRN
  Administered 2012-05-20: 5 mg via INTRAVENOUS

## 2012-05-20 MED ORDER — GLYCOPYRROLATE 0.2 MG/ML IJ SOLN
INTRAMUSCULAR | Status: DC | PRN
  Administered 2012-05-20: 0.6 mg via INTRAVENOUS

## 2012-05-20 MED ORDER — ROCURONIUM BROMIDE 100 MG/10ML IV SOLN
INTRAVENOUS | Status: DC | PRN
  Administered 2012-05-20: 50 mg via INTRAVENOUS
  Administered 2012-05-20: 10 mg via INTRAVENOUS

## 2012-05-20 MED ORDER — MORPHINE SULFATE 2 MG/ML IJ SOLN
1.0000 mg | INTRAMUSCULAR | Status: DC | PRN
  Administered 2012-05-20 – 2012-05-21 (×6): 2 mg via INTRAVENOUS
  Filled 2012-05-20 (×6): qty 1

## 2012-05-20 MED ORDER — MIDAZOLAM HCL 5 MG/5ML IJ SOLN
INTRAMUSCULAR | Status: DC | PRN
  Administered 2012-05-20: 2 mg via INTRAVENOUS

## 2012-05-20 MED ORDER — SCOPOLAMINE 1 MG/3DAYS TD PT72
MEDICATED_PATCH | TRANSDERMAL | Status: DC | PRN
  Administered 2012-05-20: 1 via TRANSDERMAL

## 2012-05-20 MED ORDER — ACETAMINOPHEN 10 MG/ML IV SOLN
INTRAVENOUS | Status: DC | PRN
  Administered 2012-05-20: 1000 mg via INTRAVENOUS

## 2012-05-20 MED ORDER — IBUPROFEN 800 MG PO TABS
800.0000 mg | ORAL_TABLET | Freq: Three times a day (TID) | ORAL | Status: DC | PRN
  Administered 2012-05-21 – 2012-05-22 (×3): 800 mg via ORAL
  Filled 2012-05-20 (×4): qty 1

## 2012-05-20 MED ORDER — SODIUM CHLORIDE 0.9 % IR SOLN
Status: DC | PRN
  Administered 2012-05-20: 1000 mL

## 2012-05-20 SURGICAL SUPPLY — 49 items
ADH SKN CLS APL DERMABOND .7 (GAUZE/BANDAGES/DRESSINGS) ×1
APL SKNCLS STERI-STRIP NONHPOA (GAUZE/BANDAGES/DRESSINGS)
BENZOIN TINCTURE PRP APPL 2/3 (GAUZE/BANDAGES/DRESSINGS) ×1 IMPLANT
BINDER ABD UNIV 12 45-62 (WOUND CARE) ×1 IMPLANT
BINDER ABDOMINAL 46IN 62IN (WOUND CARE) ×2
CANISTER SUCTION 2500CC (MISCELLANEOUS) IMPLANT
CLOTH BEACON ORANGE TIMEOUT ST (SAFETY) ×2 IMPLANT
DECANTER SPIKE VIAL GLASS SM (MISCELLANEOUS) IMPLANT
DERMABOND ADVANCED (GAUZE/BANDAGES/DRESSINGS) ×1
DERMABOND ADVANCED .7 DNX12 (GAUZE/BANDAGES/DRESSINGS) IMPLANT
DEVICE SECURE STRAP 25 ABSORB (INSTRUMENTS) ×2 IMPLANT
DEVICE TROCAR PUNCTURE CLOSURE (ENDOMECHANICALS) ×2 IMPLANT
DISSECTOR BLUNT TIP ENDO 5MM (MISCELLANEOUS) IMPLANT
DRAIN CHANNEL RND F F (WOUND CARE) IMPLANT
DRAPE INCISE IOBAN 66X45 STRL (DRAPES) ×4 IMPLANT
DRAPE LAPAROSCOPIC ABDOMINAL (DRAPES) ×2 IMPLANT
DRAPE STERI IOBAN 125X83 (DRAPES) IMPLANT
ELECT REM PT RETURN 9FT ADLT (ELECTROSURGICAL) ×2
ELECTRODE REM PT RTRN 9FT ADLT (ELECTROSURGICAL) ×1 IMPLANT
EVACUATOR SILICONE 100CC (DRAIN) IMPLANT
GLOVE BIOGEL PI IND STRL 7.0 (GLOVE) ×1 IMPLANT
GLOVE BIOGEL PI INDICATOR 7.0 (GLOVE) ×1
GLOVE SURG SIGNA 7.5 PF LTX (GLOVE) ×2 IMPLANT
GOWN STRL NON-REIN LRG LVL3 (GOWN DISPOSABLE) ×2 IMPLANT
GOWN STRL REIN XL XLG (GOWN DISPOSABLE) ×4 IMPLANT
KIT BASIN OR (CUSTOM PROCEDURE TRAY) ×2 IMPLANT
MARKER SKIN DUAL TIP RULER LAB (MISCELLANEOUS) ×2 IMPLANT
MESH PARIETEX 20X15 (Mesh General) ×2 IMPLANT
NDL SPNL 22GX3.5 QUINCKE BK (NEEDLE) ×1 IMPLANT
NEEDLE INSUFFLATION 14GA 150MM (NEEDLE) IMPLANT
NEEDLE SPNL 22GX3.5 QUINCKE BK (NEEDLE) ×2 IMPLANT
NS IRRIG 1000ML POUR BTL (IV SOLUTION) ×2 IMPLANT
PENCIL BUTTON HOLSTER BLD 10FT (ELECTRODE) ×2 IMPLANT
SCALPEL HARMONIC ACE (MISCELLANEOUS) IMPLANT
SCISSORS LAP 5X35 DISP (ENDOMECHANICALS) ×2 IMPLANT
SET IRRIG TUBING LAPAROSCOPIC (IRRIGATION / IRRIGATOR) IMPLANT
SOLUTION ANTI FOG 6CC (MISCELLANEOUS) ×2 IMPLANT
STAPLER VISISTAT 35W (STAPLE) ×1 IMPLANT
STRIP CLOSURE SKIN 1/2X4 (GAUZE/BANDAGES/DRESSINGS) ×2 IMPLANT
STRIP CLOSURE SKIN 1/4X4 (GAUZE/BANDAGES/DRESSINGS) IMPLANT
SUT NOVA 0 T19/GS 22DT (SUTURE) ×4 IMPLANT
SUT VIC AB 5-0 PS2 18 (SUTURE) ×4 IMPLANT
TACKER 5MM HERNIA 3.5CML NAB (ENDOMECHANICALS) IMPLANT
TOWEL OR 17X26 10 PK STRL BLUE (TOWEL DISPOSABLE) ×2 IMPLANT
TRAY FOLEY CATH 14FRSI W/METER (CATHETERS) ×2 IMPLANT
TRAY LAP CHOLE (CUSTOM PROCEDURE TRAY) ×2 IMPLANT
TROCAR BLADELESS OPT 5 75 (ENDOMECHANICALS) ×5 IMPLANT
TROCAR XCEL NON-BLD 11X100MML (ENDOMECHANICALS) ×2 IMPLANT
TUBING INSUFFLATION 10FT LAP (TUBING) ×2 IMPLANT

## 2012-05-20 NOTE — Op Note (Signed)
OPERATIVE NOTE  05/20/2012  9:03 AM  PATIENT:  April Mccarty, 47 y.o., female, MRN: [id number]  PREOP DIAGNOSIS:  ventral hernia  POSTOP DIAGNOSIS:   Incarcerated (with omentum) venral hernia, 2 defects - 5 x 7 cm defect  PROCEDURE:   Procedure(s): LAPAROSCOPIC VENTRAL HERNIA,  15 x 20 cm Parietex  SURGEON:   Ovidio Kin, M.D.  ASSISTANT:   None  ANESTHESIA:   general  Anesthesiologist: Gaetano Hawthorne, MD CRNA: Enriqueta Shutter; Clydene Pugh [place], CRNA  General  EBL:  Minimal  ml  BLOOD ADMINISTERED: none  DRAINS: none   LOCAL MEDICATIONS USED:   20 cc 1/4% marcaine  SPECIMEN:   None  COUNTS CORRECT:  YES  INDICATIONS FOR PROCEDURE:  [name] is a 47 y.o. (DOB: July 03, 1965) white  female whose primary care physician is Johny Blamer, MD and comes for laparoscopic ventral hernia repair.   The indications and risks of the surgery were explained to the patient.  The risks include, but are not limited to, infection, bleeding, and nerve injury.  OPERATIVE NOTE:  The patient was taken to room 1 at [facility].  He underwent a general anesthesia.  He was given 3 grams of Ancef at the beginning of the operation.   A time out was held and the surgical checklist run.   The abdomen was prepped with Cholroprep and sterilely draped.  I covered the abdomen with a Ioban drape.   I accessed the LUQ with a 5 mm trocar.  An additional 10 mm trocar was placed in the left mid abdomen.   He had a two defects in the midline below the umbilicus.  The total defect size was 5 x 7 cm.  There was omentum incarcerated in the defect.  I reduced the omentum from the hernias.  Some omentum was removed.   Then I placed a 15 x 20 cm Parietex mesh in the abdomen.  It had 8 holding sutures of 0 Novafil in a clockwise fashion.  These were tied down to secure the mesh to the anterior abdominal wall.  I then used 39 Securestrap tacks to tack the mesh to the anterior abdominal wall.  The  mesh lay flat.  Photos were taken.   The abdomen was deflated.  The mesh was inspected and there were no defects around the edge.   The trocars were then removed.  There was no bleeding at the trocar sites.  The trocar incisions were closed with 5-0 Monocryl and painted with Dermabond. The puncture sites were closed with Steristrips.   The sponge and needle count were correct.  The patient was transferred to the recovery room in good condition.    Type of repair - Mesh  (choices - primary suture, mesh, or component)  Name of mesh - Parietex  Size of mesh - Length 20 cm, Width 15 cm  Mesh overlap - 6 cm  Placement of mesh - beneath the fasc1a and into the peritoneal cavity  (choices - beneath fascia and into peritoneal cavity, beneath fascia but external to peritoneal cavity, between the muscle and fascia, above or external to fascia)  Ovidio Kin, MD, [facility] Pager: [phone] Office phone:  [phone]

## 2012-05-20 NOTE — Transfer of Care (Signed)
Immediate Anesthesia Transfer of Care Note  Patient: April Mccarty  Procedure(s) Performed: Procedure(s): LAPAROSCOPIC VENTRAL HERNIA (N/A)  Patient Location: PACU  Anesthesia Type:General  Level of Consciousness: awake and alert   Airway & Oxygen Therapy: Patient Spontanous Breathing and Patient connected to face mask oxygen  Post-op Assessment: Report given to PACU RN and Post -op Vital signs reviewed and stable  Post vital signs: Reviewed and stable  Complications: No apparent anesthesia complications

## 2012-05-20 NOTE — Interval H&P Note (Signed)
History and Physical Interval Note:  05/20/2012 7:17 AM  April Mccarty  has presented today for surgery, with the diagnosis of ventral hernia  The various methods of treatment have been discussed with the patient and family.  Her husband and mother are here today.   After consideration of risks, benefits and other options for treatment, the patient has consented to  Procedure(s): LAPAROSCOPIC VENTRAL HERNIA (N/A) as a surgical intervention .    The patient's history has been reviewed, patient examined, no change in status, stable for surgery.  I have reviewed the patient's chart and labs.  Questions were answered to the patient's satisfaction.     [name]

## 2012-05-20 NOTE — Anesthesia Postprocedure Evaluation (Signed)
  Anesthesia Post-op Note  Patient: April Mccarty  Procedure(s) Performed: Procedure(s) (LRB): LAPAROSCOPIC VENTRAL HERNIA (N/A)  Patient Location: PACU  Anesthesia Type: General  Level of Consciousness: awake and alert   Airway and Oxygen Therapy: Patient Spontanous Breathing  Post-op Pain: mild  Post-op Assessment: Post-op Vital signs reviewed, Patient's Cardiovascular Status Stable, Respiratory Function Stable, Patent Airway and No signs of Nausea or vomiting  Last Vitals:  Filed Vitals:   05/20/12 0955  BP:   Pulse: 54  Temp:   Resp: 22    Post-op Vital Signs: stable   Complications: No apparent anesthesia complications

## 2012-05-20 NOTE — H&P (View-Only) (Signed)
 Re:   April Mccarty DOB:   10/01/1965 MRN:   [id number]  ASSESSMENT AND PLAN: 1.  Ventral hernia (s)  I discussed the indications and complications of hernia surgery with the patient.  I discussed both the laparoscopic and open approach to hernia repair..  The potential risks of hernia surgery include, but are not limited to, bleeding, infection, open surgery, nerve injury, and recurrence of the hernia.    I think that she is a candidate for laparoscopic repair.  I provided the patient literature about hernia surgery.  2. Back pain 3.  Asthma - bronchitis induced 4.  Hypertension 5.  OSA - CPAP 6.  Depression/anxiety 7.  Morbid obesity  We talked about her weight and how it increases the risk of recurrence of her hernia.  Also is affects her sleep apnea and back pain.  Chief Complaint  Patient presents with  . New Evaluation   REFERRING PHYSICIAN: HARRIS, WILLIAM, MD  HISTORY OF PRESENT ILLNESS: April Mccarty is a 47 y.o. (DOB: 01/02/1966)  white  female whose primary care physician is HARRIS, WILLIAM, MD and comes to me today for abdominal pain/ventral hernias.  She is accompanied by her mother.  The patient has had a tubal ligation in 2005 and a laparoscopic cholecystectomy in 2008. She has no chronic abdominal symptoms or gastrointestinal complaints. She had fairly onset sudden upper abdominal pain located to the left lower abdomen on Monday, April 14. She had nausea early with this pain. She had a little change in her bowel habits.  She underwent a CT scan on 05/05/2012 this showed 2 anterior abdominal wall hernias and no evidence of diverticulitis.  She has had no fever or chills.  Her weight is stable.  CT scan - 05/05/2012 - "Two adjacent anterior abdominal wall hernias below the umbilicus containing only fat. There may be some inflammatory change in the area and this could possibly be symptomatic."    Past Medical History  Diagnosis Date  . Back pain   . URI  (upper respiratory infection)   . Asthma   . Environmental allergies       Past Surgical History  Procedure Laterality Date  . Tubal ligation    . Cholecystectomy  2008  . Tonsillectomy  1984      Current Outpatient Prescriptions  Medication Sig Dispense Refill  . cetirizine (ZYRTEC) 10 MG tablet Take 10 mg by mouth daily.      . escitalopram (LEXAPRO) 10 MG tablet Take 10 mg by mouth daily.      . fluticasone (VERAMYST) 27.5 MCG/SPRAY nasal spray Place 2 sprays into the nose daily.      . gabapentin (NEURONTIN) 100 MG capsule Take 200 mg by mouth at bedtime.      . hydrochlorothiazide (HYDRODIURIL) 25 MG tablet Take 25 mg by mouth daily.      . HYDROcodone-acetaminophen (NORCO/VICODIN) 5-325 MG per tablet Take 1 tablet by mouth every 6 (six) hours as needed for pain.      . ibuprofen (ADVIL,MOTRIN) 800 MG tablet Take 800 mg by mouth every 8 (eight) hours as needed for pain.      . lisinopril (PRINIVIL,ZESTRIL) 10 MG tablet Take 10 mg by mouth daily.      . azithromycin (ZITHROMAX) 250 MG tablet Take 250 mg by mouth daily.      . predniSONE (DELTASONE) 50 MG tablet Take 50 mg by mouth daily.       No current facility-administered medications for this   visit.      Allergies  Allergen Reactions  . Sulfa Antibiotics Other (See Comments)    unknown  . Tessalon (Benzonatate) Cough    REVIEW OF SYSTEMS: Skin:  No history of rash.  No history of abnormal moles. Infection:  No history of hepatitis or HIV.  No history of MRSA. Neurologic:  No history of stroke.  No history of seizure.  No history of headaches. Cardiac:  Hypertension x 4 months. Pulmonary:   Gets asthma when she gets bronchitis. OSA/CPAP x 4 months.  Endocrine:  No diabetes. No thyroid disease. Gastrointestinal:  No history of stomach disease.  No history of liver disease.  Lap chole - 2008 - Dr. Earle.  No history of pancreas disease.  No history of colon disease. Urologic:  No history of kidney stones.  No  history of bladder infections. GYN:  TL - Dr. J. McCombs - 2005 Musculoskeletal:  Chronic back pain.  She tried PT, but this seemed to make it worse.  She is not seeing a physician for this at this time. Hematologic:  No bleeding disorder.  No history of anemia.  Not anticoagulated. Psycho-social:  The patient is oriented.   The patient has no obvious psychologic or social impairment to understanding our conversation and plan.  SOCIAL and FAMILY HISTORY: Married. Mother with patient.  Note - Mother had open ventral hernia surgery by Dr. Rosenbower after diverticulitis/colon surgery.  So they are familiar with the differential diagnosis. Has 2 daughters, 12 and 18. 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  PHYSICAL EXAM: BP 132/74  Pulse 60  Temp(Src) 97.6 F (36.4 C)  Resp 18  Ht 5' 3" (1.6 m)  Wt 262 lb (118.842 kg)  BMI 46.42 kg/m2  LMP 04/28/2012  General: Obese WF who is alert and generally healthy appearing.  HEENT: Normal. Pupils equal. Neck: Supple. No mass.  No thyroid mass. Lymph Nodes:  No supraclavicular or cervical nodes. Lungs: Clear to auscultation and symmetric breath sounds. Heart:  RRR. No murmur or rub. Abdomen: Soft. No mass. No hernia. Normal bowel sounds.  Mildly tender infraumbilical and LLQ.  No peritoneal sxes.  She is so obese, I can not feel these hernias. Rectal:  No mass, guaiac neg. Extremities:  Good strength and ROM  in upper and lower extremities. Neurologic:  Grossly intact to motor and sensory function. Psychiatric: Has normal mood and affect. Behavior is normal.   DATA REVIEWED: CT scan - 05/05/2012 - "Two adjacent anterior abdominal wall hernias below the umbilicus containing only fat. There may be some inflammatory change in the area and this could possibly be symptomatic."  Charlie Char, MD,  FACS [facility] [address]  [address]   [place], [place]    [address] Phone:  [phone] FAX:   [phone]  

## 2012-05-20 NOTE — Progress Notes (Signed)
Pt. Requested to be placed on cpap at this time.  Placed on Auto titrate with humidification, pt. needing full-face mask at this time while on pca pump w/ etco2 monitoring. Bled in 2L O2, pt tolerating well.  Advised pt. She can switch to her home nasal pillows when etco2 monitoring is no longer needed.

## 2012-05-21 ENCOUNTER — Telehealth (INDEPENDENT_AMBULATORY_CARE_PROVIDER_SITE_OTHER): Payer: Self-pay

## 2012-05-21 ENCOUNTER — Encounter (HOSPITAL_COMMUNITY): Payer: Self-pay | Admitting: Surgery

## 2012-05-21 MED ORDER — CHLORHEXIDINE GLUCONATE 0.12 % MT SOLN
15.0000 mL | Freq: Two times a day (BID) | OROMUCOSAL | Status: DC
  Administered 2012-05-21 – 2012-05-23 (×5): 15 mL via OROMUCOSAL
  Filled 2012-05-21 (×7): qty 15

## 2012-05-21 NOTE — Progress Notes (Signed)
General Surgery Note  LOS: 1 day  POD -   1 Day Post-Op Room - 1536  Assessment/Plan: 1.  LAPAROSCOPIC VENTRAL HERNIA - D. Dohn Stclair - 05/20/2012  Very sore - not ready for discharge  She knows that she needs to ambulate.  (No nurse to room)  2.  Morbid Obesity 3.  OSA - on CPAP 4.  History of back pain. 5.  History of asthma - induced when she gets bronchitis 6.  History of depression/anxiety 7.  Hypertension 8.  DVT prophylaxis - SQ Heparin  Subjective:  Sore.  Keeping liquids down.  Sore in right abdomen. Objective:   Filed Vitals:   05/21/12 0509  BP: 113/55  Pulse: 53  Temp: 97.9 F (36.6 C)  Resp: 12     Intake/Output from previous day:  04/29 0701 - 04/30 0700 In: 2318.3 [I.V.:2318.3] Out: 2050 [Urine:2050]  Intake/Output this shift:  Total I/O In: -  Out: 950 [Urine:950]   Physical Exam:   General: WN obese WF who is alert and oriented.    HEENT: Normal. Pupils equal. .   Lungs: Clear.  IS about 1,600 cc   Abdomen: Tender all over, more on right side.  Rare BS.  Has binder on.   Lab Results:   No results found for this basename: WBC, HGB, HCT, PLT,  in the last 72 hours  BMET  No results found for this basename: NA, K, CL, CO2, GLUCOSE, BUN, CREATININE, CALCIUM,  in the last 72 hours  PT/INR  No results found for this basename: LABPROT, INR,  in the last 72 hours  ABG  No results found for this basename: PHART, PCO2, PO2, HCO3,  in the last 72 hours   Studies/Results:  No results found.   Anti-infectives:   Anti-infectives   Start     Dose/Rate Route Frequency Ordered Stop   05/20/12 0604  ceFAZolin (ANCEF) 3 g in dextrose 5 % 50 mL IVPB     3 g 160 mL/hr over 30 Minutes Intravenous On call to O.R. 05/20/12 0604 05/20/12 0731      Ovidio Kin, MD, FACS Pager: [phone],   [facility] Office: [phone] 05/21/2012

## 2012-05-21 NOTE — Care Management Note (Signed)
    Page 1 of 1   05/21/2012     1:55:06 PM   CARE MANAGEMENT NOTE 05/21/2012  Patient:  April Mccarty, April Mccarty   Account Number:  [id number]  Date Initiated:  05/21/2012  Documentation initiated by:  Lorenda Ishihara  Subjective/Objective Assessment:   47 yo female admitted s/p ventral hernia repair. PTA lived at home with spouse.     Action/Plan:   Home when stable   Anticipated DC Date:  05/23/2012   Anticipated DC Plan:  HOME/SELF CARE      DC Planning Services  CM consult      Choice offered to / List presented to:             Status of service:  Completed, signed off Medicare Important Message given?   (If response is "NO", the following Medicare IM given date fields will be blank) Date Medicare IM given:   Date Additional Medicare IM given:    Discharge Disposition:  HOME/SELF CARE  Per UR Regulation:  Reviewed for med. necessity/level of care/duration of stay  If discussed at Long Length of Stay Meetings, dates discussed:    Comments:

## 2012-05-21 NOTE — Telephone Encounter (Signed)
Appointment with Dr. Ezzard Standing 06/05/12@345p 

## 2012-05-22 NOTE — Progress Notes (Signed)
Patient placed herself on cpap and is tolerating cpap well at this time. RT will continue to monitor. 

## 2012-05-22 NOTE — Progress Notes (Signed)
General Surgery Note  LOS: 2 days  POD -   2 Days Post-Op Room - 1536  Assessment/Plan: 1.  LAPAROSCOPIC VENTRAL HERNIA - D. Tarina Volk - 05/20/2012  Still sore  Ambulating better  (No nurse to room)  Still not ready to go home.  2.  Morbid Obesity 3.  OSA - on CPAP 4.  History of back pain.  On NSAID's for this. 5.  History of asthma - induced when she gets bronchitis 6.  History of depression/anxiety 7.  Hypertension 8.  DVT prophylaxis - SQ Heparin  Subjective:  Starting reg food.  Abdomen still very sore.  Mother in room. Objective:   Filed Vitals:   05/22/12 0507  BP: 124/78  Pulse: 60  Temp: 97.8 F (36.6 C)  Resp: 20     Intake/Output from previous day:  04/30 0701 - 05/01 0700 In: 4258.3 [P.O.:720; I.V.:3538.3] Out: 1000 [Urine:1000]  Intake/Output this shift:      Physical Exam:   General: WN obese WF who is alert and oriented.    HEENT: Normal. Pupils equal. .   Lungs: Clear.   Abdomen: Not quite as tender as yesterday.  Rare BS.  Has binder on.   Lab Results:   No results found for this basename: WBC, HGB, HCT, PLT,  in the last 72 hours  BMET  No results found for this basename: NA, K, CL, CO2, GLUCOSE, BUN, CREATININE, CALCIUM,  in the last 72 hours  PT/INR  No results found for this basename: LABPROT, INR,  in the last 72 hours  ABG  No results found for this basename: PHART, PCO2, PO2, HCO3,  in the last 72 hours   Studies/Results:  No results found.   Anti-infectives:   Anti-infectives   Start     Dose/Rate Route Frequency Ordered Stop   05/20/12 0604  ceFAZolin (ANCEF) 3 g in dextrose 5 % 50 mL IVPB     3 g 160 mL/hr over 30 Minutes Intravenous On call to O.R. 05/20/12 0604 05/20/12 0731      Ovidio Kin, MD, FACS Pager: [phone],   [facility] Office: [phone] 05/22/2012

## 2012-05-23 MED ORDER — HYDROCODONE-ACETAMINOPHEN 5-325 MG PO TABS: 1.0000 | ORAL_TABLET | Freq: Four times a day (QID) | ORAL | Status: DC | PRN

## 2012-05-23 NOTE — Discharge Summary (Signed)
Physician Discharge Summary  Patient ID:  April Mccarty  MRN: [id number]  DOB/AGE: Mar 06, 1965 47 y.o.  Admit date: 05/20/2012 Discharge date: 05/23/2012  Discharge Diagnoses:  1. Ventral hernia x 2 2. Back pain  3. Asthma - bronchitis induced  4. Hypertension  5. OSA - CPAP  6. Depression/anxiety  7. Morbid obesity  She knows that she needs to loose weight.  Operation: Procedure(s): LAPAROSCOPIC VENTRAL HERNIA repair on 05/20/2012  Discharged Condition: good  Hospital Course: April Mccarty is an 47 y.o. female whose primary care physician is Johny Blamer, MD and who was admitted 05/20/2012 with a chief complaint of ventral incisional hernias..   She was brought to the operating room on 05/20/2012 and underwent a LAPAROSCOPIC VENTRAL HERNIA repair. The patient was very sore the first two days after surgery.  But she is now doing better and is ready for discharge.  She has passed flatus, but no BM.  Her mother is in the room with her.  The discharge instructions were reviewed with the patient.  Consults: None  Significant Diagnostic Studies: Results for orders placed during the hospital encounter of 05/15/12  SURGICAL PCR SCREEN      Result Value Range   MRSA, PCR NEGATIVE  NEGATIVE   Staphylococcus aureus NEGATIVE  NEGATIVE  CBC WITH DIFFERENTIAL      Result Value Range   WBC 6.4  4.0 - 10.5 K/uL   RBC 4.68  3.87 - 5.11 MIL/uL   Hemoglobin 12.9  12.0 - 15.0 g/dL   HCT 30.8  65.7 - 84.6 %   MCV 85.9  78.0 - 100.0 fL   MCH 27.6  26.0 - 34.0 pg   MCHC 32.1  30.0 - 36.0 g/dL   RDW 96.2  95.2 - 84.1 %   Platelets 260  150 - 400 K/uL   Neutrophils Relative 51  43 - 77 %   Neutro Abs 3.3  1.7 - 7.7 K/uL   Lymphocytes Relative 40  12 - 46 %   Lymphs Abs 2.6  0.7 - 4.0 K/uL   Monocytes Relative 5  3 - 12 %   Monocytes Absolute 0.3  0.1 - 1.0 K/uL   Eosinophils Relative 3  0 - 5 %   Eosinophils Absolute 0.2  0.0 - 0.7 K/uL   Basophils Relative 1  0 - 1 %    Basophils Absolute 0.0  0.0 - 0.1 K/uL  COMPREHENSIVE METABOLIC PANEL      Result Value Range   Sodium 140  135 - 145 mEq/L   Potassium 4.2  3.5 - 5.1 mEq/L   Chloride 102  96 - 112 mEq/L   CO2 31  19 - 32 mEq/L   Glucose, Bld 96  70 - 99 mg/dL   BUN 17  6 - 23 mg/dL   Creatinine, Ser 3.24  0.50 - 1.10 mg/dL   Calcium 9.4  8.4 - 40.1 mg/dL   Total Protein 7.3  6.0 - 8.3 g/dL   Albumin 3.5  3.5 - 5.2 g/dL   AST 21  0 - 37 U/L   ALT 22  0 - 35 U/L   Alkaline Phosphatase 67  39 - 117 U/L   Total Bilirubin 0.2 (*) 0.3 - 1.2 mg/dL   GFR calc non Af Amer >90  >90 mL/min   GFR calc Af Amer >90  >90 mL/min  HCG, SERUM, QUALITATIVE      Result Value Range   Preg, Serum NEGATIVE  NEGATIVE  Ct Abdomen Pelvis W Contrast  05/05/2012  *RADIOLOGY REPORT*  Clinical Data: Left lower quadrant abdominal pain.  Diverticulitis.  CT ABDOMEN AND PELVIS WITH CONTRAST  Technique:  Multidetector CT imaging of the abdomen and pelvis was performed following the standard protocol during bolus administration of intravenous contrast.  Contrast: OMNIPAQUE IOHEXOL 300 MG/ML  SOLN  Comparison: None.  Findings: Lung bases are clear.  No pleural or pericardial fluid.  The liver has a normal appearance without focal lesions or biliary ductal dilatation.  There has been previous cholecystectomy.  The spleen is normal.  The pancreas is normal.  The adrenal glands are normal.  The kidneys are normal.  There are parapelvic cysts on the left, not pathologic.  The aorta and IVC are normal.  No retroperitoneal mass or adenopathy.  There is no evidence of diverticulitis.  The patient does have a few small diverticulae.  There is a bilobed infraumbilical anterior midline hernia that may actually penetrate through two adjacent defects.  This contains only fat.  There may be mild inflammatory stranding.  The uterus and adnexal regions appear normal.  No free fluid. Ordinary degenerative changes effect the spine.  IMPRESSION: No  evidence of diverticulitis. Two adjacent anterior abdominal wall hernias below the umbilicus containing only fat.  There may be some inflammatory change in the area and this could possibly be symptomatic.   Original Report Authenticated By: Paulina Fusi, M.D.     Discharge Exam:  Filed Vitals:   05/23/12 0602  BP: 118/77  Pulse: 51  Temp: 98.5 F (36.9 C)  Resp: 14    General: WN obese WF who is alert and generally healthy appearing.  Lungs: Clear to auscultation and symmetric breath sounds. Heart:  RRR. No murmur or rub. Abdomen: Soft. No mass. Incisions look good.  She is wearing the abdominal binder. BS present.   Discharge Medications:     Medication List    TAKE these medications       ALPRAZolam 0.25 MG tablet  Commonly known as:  XANAX  Take 0.25 mg by mouth 3 (three) times daily as needed for anxiety.     cetirizine 10 MG tablet  Commonly known as:  ZYRTEC  Take 10 mg by mouth daily.     escitalopram 10 MG tablet  Commonly known as:  LEXAPRO  Take 10 mg by mouth every evening.     fluticasone 50 MCG/ACT nasal spray  Commonly known as:  FLONASE  Place 2 sprays into the nose daily.     gabapentin 100 MG capsule  Commonly known as:  NEURONTIN  Take 200 mg by mouth at bedtime.     hydrochlorothiazide 25 MG tablet  Commonly known as:  HYDRODIURIL  Take 25 mg by mouth daily before breakfast.     HYDROcodone-acetaminophen 5-325 MG per tablet  Commonly known as:  NORCO/VICODIN  Take 1 tablet by mouth every 6 (six) hours as needed for pain.     HYDROcodone-acetaminophen 5-325 MG per tablet  Commonly known as:  NORCO/VICODIN  Take 1-2 tablets by mouth every 6 (six) hours as needed.     ibuprofen 800 MG tablet  Commonly known as:  ADVIL,MOTRIN  Take 800 mg by mouth every 8 (eight) hours as needed for pain.     lisinopril 10 MG tablet  Commonly known as:  PRINIVIL,ZESTRIL  Take 10 mg by mouth daily before breakfast.     multivitamin with minerals Tabs   Take 1 tablet by mouth daily.  Disposition: 01-Home or Self Care      Discharge Orders   Future Appointments Provider Department Dept Phone   06/05/2012 3:45 PM Kandis Cocking, MD [facility], [place] [phone]   Future Orders Complete By Expires     Diet - low sodium heart healthy  As directed     Increase activity slowly  As directed       Return to work on:  1 to 2 weeks  Activity:  Driving - may drive in 2 or 3 days, if doing well   Lifting - No lifting > 15 pounds for 4 weeks.  Wound Care:   May shower.  Remove all bandages.  Diet:  As tolerated  Follow up appointment:  Call Dr. Allene Pyo office [facility]) at [phone] for an appointment in 2 weeks.  Medications and dosages:  Resume your home medications.  You have a prescription for:  Vicodin  Signed: Ovidio Kin, M.D., [facility]  05/23/2012, 7:26 AM

## 2012-06-05 ENCOUNTER — Ambulatory Visit (INDEPENDENT_AMBULATORY_CARE_PROVIDER_SITE_OTHER): Payer: BC Managed Care – PPO | Admitting: Surgery

## 2012-06-05 VITALS — BP 132/80 | HR 68 | Temp 97.6°F | Resp 18 | Ht 64.0 in | Wt 259.0 lb

## 2012-06-05 DIAGNOSIS — K439 Ventral hernia without obstruction or gangrene: Secondary | ICD-10-CM

## 2012-06-05 NOTE — Progress Notes (Signed)
Re:   April Mccarty DOB:   January 30, 1965 MRN:   [id number]  ASSESSMENT AND PLAN: 1.  Laparoscopic repair of ventral hernia - D. Eulogio Requena - 05/20/2012  Doing well.  Return appointment PRN.   2.  Back pain 3.  Asthma - bronchitis induced 4.  Hypertension 5.  OSA - CPAP 6.  Depression/anxiety 7.  Morbid obesity  We talked about her weight and how it increases the risk of recurrence of her hernia.  Also is affects her sleep apnea and back pain.  Chief Complaint  Patient presents with  . Routine Post Op    Vent. hernia   REFERRING PHYSICIAN: Johny Blamer, MD  HISTORY OF PRESENT ILLNESS: April Mccarty is a 47 y.o. (DOB: Jul 14, 1965)  white  female whose primary care physician is Johny Blamer, MD and comes to me today for follow up of an abdominal wall hernia.  She is doing well.  Her soreness is much better.  She is moving better.  She is trying to lose weight.  History of abdominal wall hernia: The patient has had a tubal ligation in 2005 and a laparoscopic cholecystectomy in 2008. She has no chronic abdominal symptoms or gastrointestinal complaints. She had fairly onset sudden upper abdominal pain located to the left lower abdomen on Monday, April 14. She had nausea early with this pain. She had a little change in her bowel habits.  She underwent a CT scan on 05/05/2012 this showed 2 anterior abdominal wall hernias and no evidence of diverticulitis.  She has had no fever or chills.  Her weight is stable.  CT scan - 05/05/2012 - "Two adjacent anterior abdominal wall hernias below the umbilicus containing only fat. There may be some inflammatory change in the area and this could possibly be symptomatic."   Past Medical History  Diagnosis Date  . Back pain   . URI (upper respiratory infection)   . Asthma   . Environmental allergies   . Hypertension   . Anxiety   . Depression   . Sleep apnea     cpap - does not know settings      Current Outpatient Prescriptions    Medication Sig Dispense Refill  . ALPRAZolam (XANAX) 0.25 MG tablet Take 0.25 mg by mouth 3 (three) times daily as needed for anxiety.      . cetirizine (ZYRTEC) 10 MG tablet Take 10 mg by mouth daily.      Marland Kitchen escitalopram (LEXAPRO) 10 MG tablet Take 10 mg by mouth every evening.       . fluticasone (FLONASE) 50 MCG/ACT nasal spray Place 2 sprays into the nose daily.      Marland Kitchen gabapentin (NEURONTIN) 100 MG capsule Take 200 mg by mouth at bedtime.      . hydrochlorothiazide (HYDRODIURIL) 25 MG tablet Take 25 mg by mouth daily before breakfast.       . ibuprofen (ADVIL,MOTRIN) 800 MG tablet Take 800 mg by mouth every 8 (eight) hours as needed for pain.      Marland Kitchen lisinopril (PRINIVIL,ZESTRIL) 10 MG tablet Take 10 mg by mouth daily before breakfast.       . Multiple Vitamin (MULTIVITAMIN WITH MINERALS) TABS Take 1 tablet by mouth daily.      Marland Kitchen HYDROcodone-acetaminophen (NORCO/VICODIN) 5-325 MG per tablet Take 1 tablet by mouth every 6 (six) hours as needed for pain.      Marland Kitchen HYDROcodone-acetaminophen (NORCO/VICODIN) 5-325 MG per tablet Take 1-2 tablets by mouth every 6 (six) hours as  needed.  30 tablet  1   No current facility-administered medications for this visit.      Allergies  Allergen Reactions  . Sulfa Antibiotics Other (See Comments)    unknown  . Tessalon (Benzonatate) Cough    REVIEW OF SYSTEMS: Skin:  No history of rash.  No history of abnormal moles. Infection:  No history of hepatitis or HIV.  No history of MRSA. Neurologic:  No history of stroke.  No history of seizure.  No history of headaches. Cardiac:  Hypertension x 4 months. Pulmonary:   Gets asthma when she gets bronchitis. OSA/CPAP x 4 months.  Endocrine:  No diabetes. No thyroid disease. Gastrointestinal:  No history of stomach disease.  No history of liver disease.  Lap chole - 2008 - Dr. Colin Benton.  No history of pancreas disease.  No history of colon disease. Urologic:  No history of kidney stones.  No history of bladder  infections. GYN:  TL - Dr. Lacie Draft - 2005 Musculoskeletal:  Chronic back pain.  She tried PT, but this seemed to make it worse.  She is not seeing a physician for this at this time. Hematologic:  No bleeding disorder.  No history of anemia.  Not anticoagulated. Psycho-social:  The patient is oriented.   The patient has no obvious psychologic or social impairment to understanding our conversation and plan.  SOCIAL and FAMILY HISTORY: Married. Mother with patient.  Note - Mother had open ventral hernia surgery by Dr. Abbey Chatters after diverticulitis/colon surgery.  So they are familiar with the differential diagnosis. Has 2 daughters, 42 and 64. 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  PHYSICAL EXAM: BP 132/80  Pulse 68  Temp(Src) 97.6 F (36.4 C)  Resp 18  Ht 5\' 4"  (1.626 m)  Wt 259 lb (117.482 kg)  BMI 44.44 kg/m2  LMP 04/14/2012  General: Obese WF who is alert and generally healthy appearing.  Lungs: Clear to auscultation and symmetric breath sounds. Heart:  RRR. No murmur or rub. Abdomen: Soft. No mass. Wound looks good.  DATA REVIEWED: None new.  Ovidio Kin, MD,  [facility] [address] [place]  [address]   [place], [place]    [address] Phone:  [phone] FAX:  [phone]

## 2012-06-11 ENCOUNTER — Encounter (INDEPENDENT_AMBULATORY_CARE_PROVIDER_SITE_OTHER): Payer: BC Managed Care – PPO | Admitting: Surgery

## 2012-07-08 ENCOUNTER — Telehealth (INDEPENDENT_AMBULATORY_CARE_PROVIDER_SITE_OTHER): Payer: Self-pay | Admitting: *Deleted

## 2012-07-08 ENCOUNTER — Encounter (INDEPENDENT_AMBULATORY_CARE_PROVIDER_SITE_OTHER): Payer: Self-pay | Admitting: Surgery

## 2012-07-08 ENCOUNTER — Ambulatory Visit (INDEPENDENT_AMBULATORY_CARE_PROVIDER_SITE_OTHER): Payer: BC Managed Care – PPO | Admitting: Surgery

## 2012-07-08 VITALS — BP 142/90 | HR 64 | Temp 95.6°F | Resp 15 | Ht 65.5 in | Wt 254.0 lb

## 2012-07-08 DIAGNOSIS — K439 Ventral hernia without obstruction or gangrene: Secondary | ICD-10-CM

## 2012-07-08 NOTE — Progress Notes (Addendum)
Re:   April Mccarty DOB:   1965/07/08 MRN:   [id number]  ASSESSMENT AND PLAN: 1.  Laparoscopic repair of ventral hernia - D. Corissa Oguinn - 05/20/2012  Returns with pain at the RLQ edge of the mesh.  [Patient called still complaining of pain.  I got a CT scan 07/15/2012, but this showed only post op changes.  I discussed with the patient the findings.  I'll see her back in about 2 weeks.   DN  07/16/2012]  1a.  Pain at the 7 o'clock position  Probable suture causing the issue.  Will try to wear binder, use NSAID, and give this time.  If no better, consider CT scan and possible surgery to "cut" suture.  Will see me back in 3 weeks. 2.  Back pain 3.  Asthma - bronchitis induced 4.  Hypertension 5.  OSA - CPAP 6.  Depression/anxiety 7.  Morbid obesity  She knows that she needs to loose weight.  Chief Complaint  Patient presents with  . Follow-up    po evaluate pain from hernia repair in rt side abd   REFERRING PHYSICIAN: Johny Blamer, MD  HISTORY OF PRESENT ILLNESS: April Mccarty is a 47 y.o. (DOB: January 20, 1966)  white  female whose primary care physician is Johny Blamer, MD and comes to me today for follow up of an abdominal wall hernia.  She has had worsening pain at the 7 o'clock position of her mesh repair.    She is accompanied with her mother.  She was getting better until a week ago, but now has worsening pain at the 7 o'clock position of the mesh.    History of abdominal wall hernia: The patient has had a tubal ligation in 2005 and a laparoscopic cholecystectomy in 2008. She has no chronic abdominal symptoms or gastrointestinal complaints. She had fairly onset sudden upper abdominal pain located to the left lower abdomen on Monday, April 14. She had nausea early with this pain. She had a little change in her bowel habits.  She underwent a CT scan on 05/05/2012 this showed 2 anterior abdominal wall hernias and no evidence of diverticulitis.  She has had no fever or chills.  Her  weight is stable.  CT scan - 05/05/2012 - "Two adjacent anterior abdominal wall hernias below the umbilicus containing only fat. There may be some inflammatory change in the area and this could possibly be symptomatic."   Past Medical History  Diagnosis Date  . Back pain   . URI (upper respiratory infection)   . Asthma   . Environmental allergies   . Hypertension   . Anxiety   . Depression   . Sleep apnea     cpap - does not know settings      Current Outpatient Prescriptions  Medication Sig Dispense Refill  . ALPRAZolam (XANAX) 0.25 MG tablet Take 0.25 mg by mouth 3 (three) times daily as needed for anxiety.      . cetirizine (ZYRTEC) 10 MG tablet Take 10 mg by mouth daily.      Marland Kitchen escitalopram (LEXAPRO) 10 MG tablet Take 10 mg by mouth every evening.       . fluticasone (FLONASE) 50 MCG/ACT nasal spray Place 2 sprays into the nose daily.      Marland Kitchen gabapentin (NEURONTIN) 100 MG capsule Take 200 mg by mouth at bedtime.      . hydrochlorothiazide (HYDRODIURIL) 25 MG tablet Take 25 mg by mouth daily before breakfast.       .  ibuprofen (ADVIL,MOTRIN) 800 MG tablet Take 800 mg by mouth every 8 (eight) hours as needed for pain.      Marland Kitchen lisinopril (PRINIVIL,ZESTRIL) 10 MG tablet Take 10 mg by mouth daily before breakfast.       . Multiple Vitamin (MULTIVITAMIN WITH MINERALS) TABS Take 1 tablet by mouth daily.       No current facility-administered medications for this visit.      Allergies  Allergen Reactions  . Sulfa Antibiotics Other (See Comments)    unknown  . Tessalon (Benzonatate) Cough    REVIEW OF SYSTEMS: Skin:  No history of rash.  No history of abnormal moles. Infection:  No history of hepatitis or HIV.  No history of MRSA. Neurologic:  No history of stroke.  No history of seizure.  No history of headaches. Cardiac:  Hypertension x 4 months. Pulmonary:   Gets asthma when she gets bronchitis. OSA/CPAP x 4 months.  Endocrine:  No diabetes. No thyroid  disease. Gastrointestinal:  No history of stomach disease.  No history of liver disease.  Lap chole - 2008 - Dr. Colin Benton.  No history of pancreas disease.  No history of colon disease. Urologic:  No history of kidney stones.  No history of bladder infections. GYN:  TL - Dr. Lacie Draft - 2005 Musculoskeletal:  Chronic back pain.  She tried PT, but this seemed to make it worse.  She is not seeing a physician for this at this time. Hematologic:  No bleeding disorder.  No history of anemia.  Not anticoagulated. Psycho-social:  The patient is oriented.   The patient has no obvious psychologic or social impairment to understanding our conversation and plan.  SOCIAL and FAMILY HISTORY: Married. Mother with patient.  Note - Mother had open ventral hernia surgery by Dr. Abbey Chatters after diverticulitis/colon surgery.  So they are familiar with the differential diagnosis. Has 2 daughters, 61 and 4. 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  PHYSICAL EXAM: BP 142/90  Pulse 64  Temp(Src) 95.6 F (35.3 C) (Temporal)  Resp 15  Ht 5' 5.5" (1.664 m)  Wt 254 lb (115.214 kg)  BMI 41.61 kg/m2  General: Obese WF who is alert and generally healthy appearing.  Lungs: Clear to auscultation and symmetric breath sounds. Heart:  RRR. No murmur or rub. Abdomen: Soft. No mass. Wound looks good.  Localized tenderness over the 7 o'clock suture site.  This is probably a suture problem.  DATA REVIEWED: None new.  Ovidio Kin, MD,  [facility] [address] [place]  [address]   [place], [place]    [address] Phone:  [phone] FAX:  [phone]

## 2012-07-08 NOTE — Telephone Encounter (Signed)
Patient called to state that beginning last week she began having a tenderness and soreness in the RLQ to the mid line under her umbilicus. Patient states it is progressively getting worse and at its worse is a sharp pain 9 out of 10.  Patient states she didn't hurt this badly following surgery.  Patient states movement increases the pain.  Patient also states nausea however denies any vomiting.  Patient denies any signs of infection at the incision site.  Patient states regular bowel movements and urinating normally.  Patient states she is eating normally with no change in pain with eating.

## 2012-07-08 NOTE — Telephone Encounter (Signed)
Spoke to Dr. Ezzard Standing who wants patient to be seen in urgent office this afternoon to further evaluate the pain.  Patient agreeable at this time and appt made for today.

## 2012-07-14 ENCOUNTER — Other Ambulatory Visit (INDEPENDENT_AMBULATORY_CARE_PROVIDER_SITE_OTHER): Payer: Self-pay

## 2012-07-14 ENCOUNTER — Telehealth (INDEPENDENT_AMBULATORY_CARE_PROVIDER_SITE_OTHER): Payer: Self-pay

## 2012-07-14 DIAGNOSIS — R109 Unspecified abdominal pain: Secondary | ICD-10-CM

## 2012-07-14 NOTE — Telephone Encounter (Signed)
Ct abd w/o contrast @ [facility] 07/15/12@ 1230. Patient to pickup contrast today @ [facility]  Patient aware

## 2012-07-15 ENCOUNTER — Telehealth (INDEPENDENT_AMBULATORY_CARE_PROVIDER_SITE_OTHER): Payer: Self-pay | Admitting: General Surgery

## 2012-07-15 ENCOUNTER — Ambulatory Visit (HOSPITAL_COMMUNITY)
Admission: RE | Admit: 2012-07-15 | Discharge: 2012-07-15 | Disposition: A | Discharge status: Home / Self Care | Payer: BC Managed Care – PPO | Source: Ambulatory Visit | Attending: Surgery | Admitting: Surgery

## 2012-07-15 ENCOUNTER — Other Ambulatory Visit (INDEPENDENT_AMBULATORY_CARE_PROVIDER_SITE_OTHER): Payer: Self-pay | Admitting: Surgery

## 2012-07-15 ENCOUNTER — Other Ambulatory Visit (INDEPENDENT_AMBULATORY_CARE_PROVIDER_SITE_OTHER): Payer: Self-pay

## 2012-07-15 DIAGNOSIS — R1031 Right lower quadrant pain: Secondary | ICD-10-CM | POA: Insufficient documentation

## 2012-07-15 DIAGNOSIS — R109 Unspecified abdominal pain: Secondary | ICD-10-CM

## 2012-07-15 DIAGNOSIS — Z9089 Acquired absence of other organs: Secondary | ICD-10-CM | POA: Insufficient documentation

## 2012-07-15 DIAGNOSIS — N281 Cyst of kidney, acquired: Secondary | ICD-10-CM | POA: Insufficient documentation

## 2012-07-15 DIAGNOSIS — K449 Diaphragmatic hernia without obstruction or gangrene: Secondary | ICD-10-CM | POA: Insufficient documentation

## 2012-07-15 MED ORDER — IOHEXOL 300 MG/ML  SOLN
100.0000 mL | Freq: Once | INTRAMUSCULAR | Status: AC | PRN
  Administered 2012-07-15: 100 mL via INTRAVENOUS

## 2012-07-15 NOTE — Telephone Encounter (Signed)
Trula Ore at [facility] calling to see if we can change order from CT abdomen to CT abdomen/pelvis. Glenda changed order in system. Taken to [place] to check on any change in pre-cert. She was given Christina's call back number at [phone].

## 2012-07-18 ENCOUNTER — Telehealth (INDEPENDENT_AMBULATORY_CARE_PROVIDER_SITE_OTHER): Payer: Self-pay

## 2012-07-18 NOTE — Telephone Encounter (Signed)
Patient called in stating she spoke with Dr Ezzard Standing about her Ct results. She states he told her that he could do a "numbing injection" on her if she is still symptomatic. She states she is still having pain at hernia site and would like this "injection". I told her I do not know what injection she is speaking of and we would address with Dr. Ezzard Standing later today and call her back.

## 2012-07-23 ENCOUNTER — Telehealth (INDEPENDENT_AMBULATORY_CARE_PROVIDER_SITE_OTHER): Payer: Self-pay

## 2012-07-23 NOTE — Telephone Encounter (Signed)
F/U call;  07/17/12 spoke with patients mother and she stated she would have patient call. Patient states she has a stomach virus and bronchitis per her PCP and the stomach pain has been secondary to her virus . I advised her Dr. Ezzard Standing would do an lidocaine injection on her next ov 07/29/12. Patient verbalized understanding

## 2012-07-29 ENCOUNTER — Encounter (INDEPENDENT_AMBULATORY_CARE_PROVIDER_SITE_OTHER): Payer: BC Managed Care – PPO | Admitting: Surgery

## 2012-07-30 ENCOUNTER — Other Ambulatory Visit: Payer: Self-pay | Admitting: Physician Assistant

## 2012-07-30 ENCOUNTER — Ambulatory Visit
Admission: RE | Admit: 2012-07-30 | Discharge: 2012-07-30 | Disposition: A | Discharge status: Home / Self Care | Payer: BC Managed Care – PPO | Source: Ambulatory Visit | Attending: Physician Assistant | Admitting: Physician Assistant

## 2012-07-30 DIAGNOSIS — J40 Bronchitis, not specified as acute or chronic: Secondary | ICD-10-CM

## 2014-05-03 ENCOUNTER — Encounter (HOSPITAL_COMMUNITY): Payer: Self-pay

## 2014-05-03 ENCOUNTER — Emergency Department (HOSPITAL_COMMUNITY): Payer: BLUE CROSS/BLUE SHIELD

## 2014-05-03 ENCOUNTER — Emergency Department (HOSPITAL_COMMUNITY)
Admission: EM | Admit: 2014-05-03 | Discharge: 2014-05-03 | Disposition: A | Discharge status: Home / Self Care | Payer: BLUE CROSS/BLUE SHIELD | Attending: Emergency Medicine | Admitting: Emergency Medicine

## 2014-05-03 DIAGNOSIS — F419 Anxiety disorder, unspecified: Secondary | ICD-10-CM | POA: Diagnosis not present

## 2014-05-03 DIAGNOSIS — F329 Major depressive disorder, single episode, unspecified: Secondary | ICD-10-CM | POA: Insufficient documentation

## 2014-05-03 DIAGNOSIS — R079 Chest pain, unspecified: Secondary | ICD-10-CM | POA: Insufficient documentation

## 2014-05-03 DIAGNOSIS — Z79899 Other long term (current) drug therapy: Secondary | ICD-10-CM | POA: Insufficient documentation

## 2014-05-03 DIAGNOSIS — G4730 Sleep apnea, unspecified: Secondary | ICD-10-CM | POA: Insufficient documentation

## 2014-05-03 DIAGNOSIS — Z87891 Personal history of nicotine dependence: Secondary | ICD-10-CM | POA: Diagnosis not present

## 2014-05-03 DIAGNOSIS — Z7951 Long term (current) use of inhaled steroids: Secondary | ICD-10-CM | POA: Insufficient documentation

## 2014-05-03 DIAGNOSIS — G44209 Tension-type headache, unspecified, not intractable: Secondary | ICD-10-CM | POA: Diagnosis not present

## 2014-05-03 DIAGNOSIS — J45909 Unspecified asthma, uncomplicated: Secondary | ICD-10-CM | POA: Insufficient documentation

## 2014-05-03 DIAGNOSIS — R52 Pain, unspecified: Secondary | ICD-10-CM

## 2014-05-03 DIAGNOSIS — Z9981 Dependence on supplemental oxygen: Secondary | ICD-10-CM | POA: Insufficient documentation

## 2014-05-03 DIAGNOSIS — R109 Unspecified abdominal pain: Secondary | ICD-10-CM | POA: Diagnosis not present

## 2014-05-03 DIAGNOSIS — I10 Essential (primary) hypertension: Secondary | ICD-10-CM | POA: Insufficient documentation

## 2014-05-03 LAB — CBC WITH DIFFERENTIAL/PLATELET
BASOS PCT: 0 % (ref 0–1)
Basophils Absolute: 0 10*3/uL (ref 0.0–0.1)
EOS PCT: 2 % (ref 0–5)
Eosinophils Absolute: 0.2 10*3/uL (ref 0.0–0.7)
HEMATOCRIT: 42.7 % (ref 36.0–46.0)
Hemoglobin: 13.8 g/dL (ref 12.0–15.0)
Lymphocytes Relative: 40 % (ref 12–46)
Lymphs Abs: 3.4 10*3/uL (ref 0.7–4.0)
MCH: 27.8 pg (ref 26.0–34.0)
MCHC: 32.3 g/dL (ref 30.0–36.0)
MCV: 86.1 fL (ref 78.0–100.0)
MONOS PCT: 6 % (ref 3–12)
Monocytes Absolute: 0.5 10*3/uL (ref 0.1–1.0)
NEUTROS PCT: 52 % (ref 43–77)
Neutro Abs: 4.3 10*3/uL (ref 1.7–7.7)
Platelets: 254 10*3/uL (ref 150–400)
RBC: 4.96 MIL/uL (ref 3.87–5.11)
RDW: 15 % (ref 11.5–15.5)
WBC: 8.4 10*3/uL (ref 4.0–10.5)

## 2014-05-03 LAB — COMPREHENSIVE METABOLIC PANEL
ALK PHOS: 69 U/L (ref 39–117)
ALT: 17 U/L (ref 0–35)
ANION GAP: 11 (ref 5–15)
AST: 20 U/L (ref 0–37)
Albumin: 3.9 g/dL (ref 3.5–5.2)
BILIRUBIN TOTAL: 0.6 mg/dL (ref 0.3–1.2)
BUN: 12 mg/dL (ref 6–23)
CHLORIDE: 104 mmol/L (ref 96–112)
CO2: 25 mmol/L (ref 19–32)
Calcium: 9.2 mg/dL (ref 8.4–10.5)
Creatinine, Ser: 0.61 mg/dL (ref 0.50–1.10)
GFR calc Af Amer: >90 mL/min (ref >90)
GFR calc non Af Amer: >90 mL/min (ref >90)
Glucose, Bld: 85 mg/dL (ref 70–99)
POTASSIUM: 4.3 mmol/L (ref 3.5–5.1)
Sodium: 140 mmol/L (ref 135–145)
Total Protein: 7.6 g/dL (ref 6.0–8.3)

## 2014-05-03 LAB — TROPONIN I: Troponin I: <0.03 ng/mL (ref <0.031)

## 2014-05-03 MED ORDER — KETOROLAC TROMETHAMINE 30 MG/ML IJ SOLN
30.0000 mg | Freq: Once | INTRAMUSCULAR | Status: AC
  Administered 2014-05-03: 30 mg via INTRAVENOUS
  Filled 2014-05-03: qty 1

## 2014-05-03 MED ORDER — HYDROCODONE-ACETAMINOPHEN 5-325 MG PO TABS
2.0000 | ORAL_TABLET | Freq: Once | ORAL | Status: AC
  Administered 2014-05-03: 2 via ORAL
  Filled 2014-05-03: qty 2

## 2014-05-03 MED ORDER — SODIUM CHLORIDE 0.9 % IV BOLUS (SEPSIS)
1000.0000 mL | Freq: Once | INTRAVENOUS | Status: AC
  Administered 2014-05-03: 1000 mL via INTRAVENOUS

## 2014-05-03 NOTE — Discharge Instructions (Signed)
Headaches, Frequently Asked Questions °MIGRAINE HEADACHES °Q: What is migraine? What causes it? How can I treat it? °A: Generally, migraine headaches begin as a dull ache. Then they develop into a constant, throbbing, and pulsating pain. You may experience pain at the temples. You may experience pain at the front or back of one or both sides of the head. The pain is usually accompanied by a combination of: °· Nausea. °· Vomiting. °· Sensitivity to light and noise. °Some people (about 15%) experience an aura (see below) before an attack. The cause of migraine is believed to be chemical reactions in the brain. Treatment for migraine may include over-the-counter or prescription medications. It may also include self-help techniques. These include relaxation training and biofeedback.  °Q: What is an aura? °A: About 15% of people with migraine get an "aura". This is a sign of neurological symptoms that occur before a migraine headache. You may see wavy or jagged lines, dots, or flashing lights. You might experience tunnel vision or blind spots in one or both eyes. The aura can include visual or auditory hallucinations (something imagined). It may include disruptions in smell (such as strange odors), taste or touch. Other symptoms include: °· Numbness. °· A "pins and needles" sensation. °· Difficulty in recalling or speaking the correct word. °These neurological events may last as long as 60 minutes. These symptoms will fade as the headache begins. °Q: What is a trigger? °A: Certain physical or environmental factors can lead to or "trigger" a migraine. These include: °· Foods. °· Hormonal changes. °· Weather. °· Stress. °It is important to remember that triggers are different for everyone. To help prevent migraine attacks, you need to figure out which triggers affect you. Keep a headache diary. This is a good way to track triggers. The diary will help you talk to your healthcare professional about your condition. °Q: Does  weather affect migraines? °A: Bright sunshine, hot, humid conditions, and drastic changes in barometric pressure may lead to, or "trigger," a migraine attack in some people. But studies have shown that weather does not act as a trigger for everyone with migraines. °Q: What is the link between migraine and hormones? °A: Hormones start and regulate many of your body's functions. Hormones keep your body in balance within a constantly changing environment. The levels of hormones in your body are unbalanced at times. Examples are during menstruation, pregnancy, or menopause. That can lead to a migraine attack. In fact, about three quarters of all women with migraine report that their attacks are related to the menstrual cycle.  °Q: Is there an increased risk of stroke for migraine sufferers? °A: The likelihood of a migraine attack causing a stroke is very remote. That is not to say that migraine sufferers cannot have a stroke associated with their migraines. In persons under age 40, the most common associated factor for stroke is migraine headache. But over the course of a person's normal life span, the occurrence of migraine headache may actually be associated with a reduced risk of dying from cerebrovascular disease due to stroke.  °Q: What are acute medications for migraine? °A: Acute medications are used to treat the pain of the headache after it has started. Examples over-the-counter medications, NSAIDs, ergots, and triptans.  °Q: What are the triptans? °A: Triptans are the newest class of abortive medications. They are specifically targeted to treat migraine. Triptans are vasoconstrictors. They moderate some chemical reactions in the brain. The triptans work on receptors in your brain. Triptans help   to restore the balance of a neurotransmitter called serotonin. Fluctuations in levels of serotonin are thought to be a main cause of migraine.  °Q: Are over-the-counter medications for migraine effective? °A:  Over-the-counter, or "OTC," medications may be effective in relieving mild to moderate pain and associated symptoms of migraine. But you should see your caregiver before beginning any treatment regimen for migraine.  °Q: What are preventive medications for migraine? °A: Preventive medications for migraine are sometimes referred to as "prophylactic" treatments. They are used to reduce the frequency, severity, and length of migraine attacks. Examples of preventive medications include antiepileptic medications, antidepressants, beta-blockers, calcium channel blockers, and NSAIDs (nonsteroidal anti-inflammatory drugs). °Q: Why are anticonvulsants used to treat migraine? °A: During the past few years, there has been an increased interest in antiepileptic drugs for the prevention of migraine. They are sometimes referred to as "anticonvulsants". Both epilepsy and migraine may be caused by similar reactions in the brain.  °Q: Why are antidepressants used to treat migraine? °A: Antidepressants are typically used to treat people with depression. They may reduce migraine frequency by regulating chemical levels, such as serotonin, in the brain.  °Q: What alternative therapies are used to treat migraine? °A: The term "alternative therapies" is often used to describe treatments considered outside the scope of conventional Western medicine. Examples of alternative therapy include acupuncture, acupressure, and yoga. Another common alternative treatment is herbal therapy. Some herbs are believed to relieve headache pain. Always discuss alternative therapies with your caregiver before proceeding. Some herbal products contain arsenic and other toxins. °TENSION HEADACHES °Q: What is a tension-type headache? What causes it? How can I treat it? °A: Tension-type headaches occur randomly. They are often the result of temporary stress, anxiety, fatigue, or anger. Symptoms include soreness in your temples, a tightening band-like sensation  around your head (a "vice-like" ache). Symptoms can also include a pulling feeling, pressure sensations, and contracting head and neck muscles. The headache begins in your forehead, temples, or the back of your head and neck. Treatment for tension-type headache may include over-the-counter or prescription medications. Treatment may also include self-help techniques such as relaxation training and biofeedback. °CLUSTER HEADACHES °Q: What is a cluster headache? What causes it? How can I treat it? °A: Cluster headache gets its name because the attacks come in groups. The pain arrives with little, if any, warning. It is usually on one side of the head. A tearing or bloodshot eye and a runny nose on the same side of the headache may also accompany the pain. Cluster headaches are believed to be caused by chemical reactions in the brain. They have been described as the most severe and intense of any headache type. Treatment for cluster headache includes prescription medication and oxygen. °SINUS HEADACHES °Q: What is a sinus headache? What causes it? How can I treat it? °A: When a cavity in the bones of the face and skull (a sinus) becomes inflamed, the inflammation will cause localized pain. This condition is usually the result of an allergic reaction, a tumor, or an infection. If your headache is caused by a sinus blockage, such as an infection, you will probably have a fever. An x-ray will confirm a sinus blockage. Your caregiver's treatment might include antibiotics for the infection, as well as antihistamines or decongestants.  °REBOUND HEADACHES °Q: What is a rebound headache? What causes it? How can I treat it? °A: A pattern of taking acute headache medications too often can lead to a condition known as "rebound headache."   A pattern of taking too much headache medication includes taking it more than 2 days per week or in excessive amounts. That means more than the label or a caregiver advises. With rebound  headaches, your medications not only stop relieving pain, they actually begin to cause headaches. Doctors treat rebound headache by tapering the medication that is being overused. Sometimes your caregiver will gradually substitute a different type of treatment or medication. Stopping may be a challenge. Regularly overusing a medication increases the potential for serious side effects. Consult a caregiver if you regularly use headache medications more than 2 days per week or more than the label advises. °ADDITIONAL QUESTIONS AND ANSWERS °Q: What is biofeedback? °A: Biofeedback is a self-help treatment. Biofeedback uses special equipment to monitor your body's involuntary physical responses. Biofeedback monitors: °· Breathing. °· Pulse. °· Heart rate. °· Temperature. °· Muscle tension. °· Brain activity. °Biofeedback helps you refine and perfect your relaxation exercises. You learn to control the physical responses that are related to stress. Once the technique has been mastered, you do not need the equipment any more. °Q: Are headaches hereditary? °A: Four out of five (80%) of people that suffer report a family history of migraine. Scientists are not sure if this is genetic or a family predisposition. Despite the uncertainty, a child has a 50% chance of having migraine if one parent suffers. The child has a 75% chance if both parents suffer.  °Q: Can children get headaches? °A: By the time they reach high school, most young people have experienced some type of headache. Many safe and effective approaches or medications can prevent a headache from occurring or stop it after it has begun.  °Q: What type of doctor should I see to diagnose and treat my headache? °A: Start with your primary caregiver. Discuss his or her experience and approach to headaches. Discuss methods of classification, diagnosis, and treatment. Your caregiver may decide to recommend you to a headache specialist, depending upon your symptoms or other  physical conditions. Having diabetes, allergies, etc., may require a more comprehensive and inclusive approach to your headache. The [employer] will provide, upon request, a list of NHF physician members in your state. °Document Released: 03/31/2003 Document Revised: 04/02/2011 Document Reviewed: 09/08/2007 °ExitCare® Patient Information ©2015 [employer]. This information is not intended to replace advice given to you by your health care provider. Make sure you discuss any questions you have with your health care provider. ° °Chest Pain (Nonspecific) °It is often hard to give a specific diagnosis for the cause of chest pain. There is always a chance that your pain could be related to something serious, such as a heart attack or a blood clot in the lungs. You need to follow up with your health care provider for further evaluation. °CAUSES  °· Heartburn. °· Pneumonia or bronchitis. °· Anxiety or stress. °· Inflammation around your heart (pericarditis) or lung (pleuritis or pleurisy). °· A blood clot in the lung. °· A collapsed lung (pneumothorax). It can develop suddenly on its own (spontaneous pneumothorax) or from trauma to the chest. °· Shingles infection (herpes zoster virus). °The chest wall is composed of bones, muscles, and cartilage. Any of these can be the source of the pain. °· The bones can be bruised by injury. °· The muscles or cartilage can be strained by coughing or overwork. °· The cartilage can be affected by inflammation and become sore (costochondritis). °DIAGNOSIS  °Lab tests or other studies may be needed to find the   cause of your pain. Your health care provider may have you take a test called an ambulatory electrocardiogram (ECG). An ECG records your heartbeat patterns over a 24-hour period. You may also have other tests, such as: °· Transthoracic echocardiogram (TTE). During echocardiography, sound waves are used to evaluate how blood flows through your  heart. °· Transesophageal echocardiogram (TEE). °· Cardiac monitoring. This allows your health care provider to monitor your heart rate and rhythm in real time. °· Holter monitor. This is a portable device that records your heartbeat and can help diagnose heart arrhythmias. It allows your health care provider to track your heart activity for several days, if needed. °· Stress tests by exercise or by giving medicine that makes the heart beat faster. °TREATMENT  °· Treatment depends on what may be causing your chest pain. Treatment may include: °¨ Acid blockers for heartburn. °¨ Anti-inflammatory medicine. °¨ Pain medicine for inflammatory conditions. °¨ Antibiotics if an infection is present. °· You may be advised to change lifestyle habits. This includes stopping smoking and avoiding alcohol, caffeine, and chocolate. °· You may be advised to keep your head raised (elevated) when sleeping. This reduces the chance of acid going backward from your stomach into your esophagus. °Most of the time, nonspecific chest pain will improve within 2-3 days with rest and mild pain medicine.  °HOME CARE INSTRUCTIONS  °· If antibiotics were prescribed, take them as directed. Finish them even if you start to feel better. °· For the next few days, avoid physical activities that bring on chest pain. Continue physical activities as directed. °· Do not use any tobacco products, including cigarettes, chewing tobacco, or electronic cigarettes. °· Avoid drinking alcohol. °· Only take medicine as directed by your health care provider. °· Follow your health care provider's suggestions for further testing if your chest pain does not go away. °· Keep any follow-up appointments you made. If you do not go to an appointment, you could develop lasting (chronic) problems with pain. If there is any problem keeping an appointment, call to reschedule. °SEEK MEDICAL CARE IF:  °· Your chest pain does not go away, even after treatment. °· You have a rash  with blisters on your chest. °· You have a fever. °SEEK IMMEDIATE MEDICAL CARE IF:  °· You have increased chest pain or pain that spreads to your arm, neck, jaw, back, or abdomen. °· You have shortness of breath. °· You have an increasing cough, or you cough up blood. °· You have severe back or abdominal pain. °· You feel nauseous or vomit. °· You have severe weakness. °· You faint. °· You have chills. °This is an emergency. Do not wait to see if the pain will go away. Get medical help at once. Call your local emergency services (911 in [place] Do not drive yourself to the hospital. °MAKE SURE YOU:  °· Understand these instructions. °· Will watch your condition. °· Will get help right away if you are not doing well or get worse. °Document Released: 10/18/2004 Document Revised: 01/13/2013 Document Reviewed: 08/14/2007 °ExitCare® Patient Information ©2015 [employer]. This information is not intended to replace advice given to you by your health care provider. Make sure you discuss any questions you have with your health care provider. ° °

## 2014-05-03 NOTE — ED Provider Notes (Signed)
CSN: [id number]     Arrival date & time 05/03/14  1203 History   First MD Initiated Contact with Patient 05/03/14 1206     Chief Complaint  Patient presents with  . Chest Pain     (Consider location/radiation/quality/duration/timing/severity/associated sxs/prior Treatment) Patient is a 49 y.o. female presenting with chest pain. The history is provided by the patient. No language interpreter was used.  Chest Pain Pain location:  L chest Pain quality: aching   Pain radiates to:  Does not radiate Pain radiates to the back: no   Pain severity:  Moderate Onset quality:  Gradual Duration:  6 hours Timing:  Unable to specify Progression:  Worsening Chronicity:  New Context: not breathing, no movement and not raising an arm   Relieved by:  Nothing Worsened by:  Nothing tried Ineffective treatments:  None tried Associated symptoms: abdominal pain   Associated symptoms: no shortness of breath   Risk factors: no high cholesterol and no hypertension   Pt complains of having a headache this morning.  Pt reports she was in a hot tub all weekend.  Pt thinks she may be dehydrated.  Pt reports she had some discomfort in her chest.  Pt reports pain resolved with asa and one nitroglycerin.  Pt reports she still has a slight headache.   Past Medical History  Diagnosis Date  . Back pain   . URI (upper respiratory infection)   . Asthma   . Environmental allergies   . Hypertension   . Anxiety   . Depression   . Sleep apnea     cpap - does not know settings    Past Surgical History  Procedure Laterality Date  . Tubal ligation    . Cholecystectomy  2008  . Tonsillectomy  1984  . Ventral hernia repair N/A 05/20/2012    Procedure: LAPAROSCOPIC VENTRAL HERNIA;  Surgeon: Kandis Cocking, MD;  Location: [facility];  Service: General;  Laterality: N/A;   Family History  Problem Relation Age of Onset  . Diabetes Mother    History  Substance Use Topics  . Smoking status: Former Smoker    Quit  date: 01/23/1992  . Smokeless tobacco: Never Used  . Alcohol Use: No   OB History    No data available     Review of Systems  Respiratory: Negative for shortness of breath.   Cardiovascular: Positive for chest pain.  Gastrointestinal: Positive for abdominal pain.  All other systems reviewed and are negative.     Allergies  Sulfa antibiotics and Tessalon  Home Medications   Prior to Admission medications   Medication Sig Start Date End Date Taking? Authorizing Provider  ALPRAZolam (XANAX) 0.25 MG tablet Take 0.25 mg by mouth 3 (three) times daily as needed for anxiety.    Historical Provider, MD  cetirizine (ZYRTEC) 10 MG tablet Take 10 mg by mouth daily.    Historical Provider, MD  escitalopram (LEXAPRO) 10 MG tablet Take 10 mg by mouth every evening.     Historical Provider, MD  fluticasone (FLONASE) 50 MCG/ACT nasal spray Place 2 sprays into the nose daily.    Historical Provider, MD  gabapentin (NEURONTIN) 100 MG capsule Take 200 mg by mouth at bedtime.    Historical Provider, MD  hydrochlorothiazide (HYDRODIURIL) 25 MG tablet Take 25 mg by mouth daily before breakfast.     Historical Provider, MD  ibuprofen (ADVIL,MOTRIN) 800 MG tablet Take 800 mg by mouth every 8 (eight) hours as needed for pain.  Historical Provider, MD  lisinopril (PRINIVIL,ZESTRIL) 10 MG tablet Take 10 mg by mouth daily before breakfast.     Historical Provider, MD  Multiple Vitamin (MULTIVITAMIN WITH MINERALS) TABS Take 1 tablet by mouth daily.    Historical Provider, MD   BP 121/62 mmHg  Pulse 61  Temp(Src) 98.4 F (36.9 C) (Oral)  Resp 20  Ht  (1.651 m)  Wt 255 lb (115.667 kg)  BMI 42.43 kg/m2  SpO2 100%  LMP 04/18/2014 Physical Exam  Constitutional: She is oriented to person, place, and time. She appears well-developed and well-nourished.  HENT:  Head: Normocephalic and atraumatic.  Right Ear: External ear normal.  Left Ear: External ear normal.  Nose: Nose normal.  Mouth/Throat:  Oropharynx is clear and moist.  Eyes: Conjunctivae and EOM are normal. Pupils are equal, round, and reactive to light.  Neck: Normal range of motion.  Pulmonary/Chest: Effort normal.  Abdominal: Soft. She exhibits no distension.  Musculoskeletal: Normal range of motion.  Neurological: She is alert and oriented to person, place, and time.  Skin: Skin is warm.  Psychiatric: She has a normal mood and affect.  Nursing note and vitals reviewed.   ED Course  Procedures (including critical care time) Labs Review Labs Reviewed - No data to display  Imaging Review No results found.   EKG Interpretation   Date/Time:  Monday May 03 2014 12:06:04 EDT Ventricular Rate:  53 PR Interval:  154 QRS Duration: 95 QT Interval:  444 QTC Calculation: 417 R Axis:   60 Text Interpretation:  Sinus rhythm Low voltage, precordial leads Confirmed  by Lincoln Brigham [phone]) on 05/03/2014 12:08:04 PM      Results for orders placed or performed during the hospital encounter of 05/03/14  CBC with Differential/Platelet  Result Value Ref Range   WBC 8.4 4.0 - 10.5 K/uL   RBC 4.96 3.87 - 5.11 MIL/uL   Hemoglobin 13.8 12.0 - 15.0 g/dL   HCT 41.3 24.4 - 01.0 %   MCV 86.1 78.0 - 100.0 fL   MCH 27.8 26.0 - 34.0 pg   MCHC 32.3 30.0 - 36.0 g/dL   RDW 27.2 53.6 - 64.4 %   Platelets 254 150 - 400 K/uL   Neutrophils Relative % 52 43 - 77 %   Lymphocytes Relative 40 12 - 46 %   Monocytes Relative 6 3 - 12 %   Eosinophils Relative 2 0 - 5 %   Basophils Relative 0 0 - 1 %   Neutro Abs 4.3 1.7 - 7.7 K/uL   Lymphs Abs 3.4 0.7 - 4.0 K/uL   Monocytes Absolute 0.5 0.1 - 1.0 K/uL   Eosinophils Absolute 0.2 0.0 - 0.7 K/uL   Basophils Absolute 0.0 0.0 - 0.1 K/uL   Smear Review LARGE PLATELETS PRESENT   Comprehensive metabolic panel  Result Value Ref Range   Sodium 140 135 - 145 mmol/L   Potassium 4.3 3.5 - 5.1 mmol/L   Chloride 104 96 - 112 mmol/L   CO2 25 19 - 32 mmol/L   Glucose, Bld 85 70 - 99 mg/dL   BUN 12  6 - 23 mg/dL   Creatinine, Ser 0.34 0.50 - 1.10 mg/dL   Calcium 9.2 8.4 - 74.2 mg/dL   Total Protein 7.6 6.0 - 8.3 g/dL   Albumin 3.9 3.5 - 5.2 g/dL   AST 20 0 - 37 U/L   ALT 17 0 - 35 U/L   Alkaline Phosphatase 69 39 - 117 U/L   Total  Bilirubin 0.6 0.3 - 1.2 mg/dL   GFR calc non Af Amer >90 >90 mL/min   GFR calc Af Amer >90 >90 mL/min   Anion gap 11 5 - 15  Troponin I  Result Value Ref Range   Troponin I <0.03 <0.031 ng/mL  Troponin I  Result Value Ref Range   Troponin I <0.03 <0.031 ng/mL   Dg Chest 2 View  05/03/2014   CLINICAL DATA:  Chest pain, nausea, headache at work  EXAM: CHEST  2 VIEW  COMPARISON:  07/30/2012  FINDINGS: Cardiomediastinal silhouette is stable. No acute infiltrate or pleural effusion. No pulmonary edema. Mild degenerative changes lower thoracic spine.  IMPRESSION: No active cardiopulmonary disease.   Electronically Signed   By: Natasha MeadLiviu  Pop M.D.   On: 05/03/2014 14:35     MDM   Final diagnoses:  Pain  Chest pain, unspecified chest pain type  Tension-type headache, not intractable, unspecified chronicity pattern        Elson AreasLeslie K Sofia, PA-C 05/03/14 1658  Tilden FossaElizabeth Rees, MD 05/03/14 1714

## 2014-05-03 NOTE — ED Notes (Signed)
Per EMS - pt was at work, began to experience chest pain/nausea/light-headedness. Initial pain 5/10, given 1 nitro, pain 0/10. 12-lead unremarkable, pt warm/dry, BP initially 140/100, last BP 154/84. Denies any chest pain or nausea. Given 324mg  aspirin, 4mg  zofran, 20G right AC.

## 2014-08-05 ENCOUNTER — Other Ambulatory Visit: Payer: Self-pay | Admitting: Family Medicine

## 2014-08-05 DIAGNOSIS — M545 Low back pain, unspecified: Secondary | ICD-10-CM

## 2014-08-14 ENCOUNTER — Other Ambulatory Visit: Payer: BLUE CROSS/BLUE SHIELD

## 2015-05-09 ENCOUNTER — Ambulatory Visit
Admission: RE | Admit: 2015-05-09 | Discharge: 2015-05-09 | Disposition: A | Discharge status: Home / Self Care | Payer: BLUE CROSS/BLUE SHIELD | Source: Ambulatory Visit | Attending: Family Medicine | Admitting: Family Medicine

## 2015-05-09 ENCOUNTER — Other Ambulatory Visit: Payer: Self-pay | Admitting: Family Medicine

## 2015-05-09 DIAGNOSIS — R609 Edema, unspecified: Secondary | ICD-10-CM

## 2015-05-09 DIAGNOSIS — M79605 Pain in left leg: Secondary | ICD-10-CM

## 2015-05-11 ENCOUNTER — Other Ambulatory Visit: Payer: Self-pay | Admitting: Family Medicine

## 2015-05-11 ENCOUNTER — Other Ambulatory Visit (HOSPITAL_COMMUNITY)
Admission: RE | Admit: 2015-05-11 | Discharge: 2015-05-11 | Disposition: A | Discharge status: Home / Self Care | Payer: BLUE CROSS/BLUE SHIELD | Source: Ambulatory Visit | Attending: Family Medicine | Admitting: Family Medicine

## 2015-05-11 DIAGNOSIS — Z1151 Encounter for screening for human papillomavirus (HPV): Secondary | ICD-10-CM | POA: Insufficient documentation

## 2015-05-11 DIAGNOSIS — Z01419 Encounter for gynecological examination (general) (routine) without abnormal findings: Secondary | ICD-10-CM | POA: Diagnosis present

## 2015-05-12 LAB — CYTOLOGY - PAP

## 2016-11-12 ENCOUNTER — Other Ambulatory Visit: Payer: Self-pay | Admitting: Family Medicine

## 2016-11-12 DIAGNOSIS — M5416 Radiculopathy, lumbar region: Secondary | ICD-10-CM

## 2016-11-18 ENCOUNTER — Other Ambulatory Visit: Payer: BLUE CROSS/BLUE SHIELD

## 2017-03-28 ENCOUNTER — Other Ambulatory Visit: Payer: Self-pay | Admitting: Family Medicine

## 2017-03-28 DIAGNOSIS — M5441 Lumbago with sciatica, right side: Secondary | ICD-10-CM

## 2017-04-20 ENCOUNTER — Ambulatory Visit
Admission: RE | Admit: 2017-04-20 | Discharge: 2017-04-20 | Disposition: A | Discharge status: Home / Self Care | Payer: Self-pay | Source: Ambulatory Visit | Attending: Family Medicine | Admitting: Family Medicine

## 2017-04-20 DIAGNOSIS — M5441 Lumbago with sciatica, right side: Secondary | ICD-10-CM

## 2017-04-24 ENCOUNTER — Other Ambulatory Visit: Payer: BLUE CROSS/BLUE SHIELD

## 2017-08-06 DIAGNOSIS — M25562 Pain in left knee: Secondary | ICD-10-CM | POA: Insufficient documentation

## 2017-08-06 DIAGNOSIS — G8929 Other chronic pain: Secondary | ICD-10-CM | POA: Insufficient documentation

## 2018-02-26 DIAGNOSIS — M25512 Pain in left shoulder: Secondary | ICD-10-CM | POA: Insufficient documentation

## 2018-03-06 DIAGNOSIS — M7502 Adhesive capsulitis of left shoulder: Secondary | ICD-10-CM | POA: Insufficient documentation

## 2018-03-10 ENCOUNTER — Other Ambulatory Visit: Payer: Self-pay | Admitting: Specialist

## 2018-03-10 DIAGNOSIS — M25512 Pain in left shoulder: Secondary | ICD-10-CM

## 2018-03-26 ENCOUNTER — Other Ambulatory Visit: Payer: Self-pay

## 2018-04-02 ENCOUNTER — Other Ambulatory Visit: Payer: Self-pay

## 2018-04-06 ENCOUNTER — Other Ambulatory Visit: Payer: Self-pay

## 2018-06-11 ENCOUNTER — Other Ambulatory Visit: Payer: Self-pay

## 2018-06-11 ENCOUNTER — Ambulatory Visit
Admission: RE | Admit: 2018-06-11 | Discharge: 2018-06-11 | Disposition: A | Discharge status: Home / Self Care | Payer: BLUE CROSS/BLUE SHIELD | Source: Ambulatory Visit | Attending: Specialist | Admitting: Specialist

## 2018-06-11 DIAGNOSIS — M25512 Pain in left shoulder: Secondary | ICD-10-CM

## 2018-06-12 ENCOUNTER — Other Ambulatory Visit: Payer: Self-pay | Admitting: Specialist

## 2018-06-12 DIAGNOSIS — G8929 Other chronic pain: Secondary | ICD-10-CM

## 2018-06-17 DIAGNOSIS — M19019 Primary osteoarthritis, unspecified shoulder: Secondary | ICD-10-CM | POA: Insufficient documentation

## 2018-06-19 ENCOUNTER — Other Ambulatory Visit: Payer: Self-pay

## 2018-06-19 ENCOUNTER — Ambulatory Visit (HOSPITAL_COMMUNITY)
Admission: RE | Admit: 2018-06-19 | Discharge: 2018-06-19 | Disposition: A | Discharge status: Home / Self Care | Payer: BLUE CROSS/BLUE SHIELD | Source: Ambulatory Visit | Attending: Cardiology | Admitting: Cardiology

## 2018-06-19 ENCOUNTER — Other Ambulatory Visit (HOSPITAL_COMMUNITY): Payer: Self-pay | Admitting: Specialist

## 2018-06-19 DIAGNOSIS — M79662 Pain in left lower leg: Secondary | ICD-10-CM

## 2018-06-19 DIAGNOSIS — M7989 Other specified soft tissue disorders: Secondary | ICD-10-CM

## 2018-08-19 ENCOUNTER — Ambulatory Visit (HOSPITAL_BASED_OUTPATIENT_CLINIC_OR_DEPARTMENT_OTHER): Admission: RE | Admit: 2018-08-19 | Payer: BC Managed Care – PPO | Source: Home / Self Care | Admitting: Specialist

## 2018-08-19 ENCOUNTER — Encounter (HOSPITAL_BASED_OUTPATIENT_CLINIC_OR_DEPARTMENT_OTHER): Admission: RE | Payer: Self-pay | Source: Home / Self Care

## 2018-08-19 SURGERY — ARTHROSCOPY, SHOULDER, WITH ROTATOR CUFF REPAIR
Anesthesia: General | Laterality: Left

## 2018-08-29 ENCOUNTER — Other Ambulatory Visit: Payer: Self-pay

## 2018-08-29 DIAGNOSIS — Z20822 Contact with and (suspected) exposure to covid-19: Secondary | ICD-10-CM

## 2018-08-31 LAB — NOVEL CORONAVIRUS, NAA: SARS-CoV-2, NAA: NOT DETECTED

## 2019-06-08 ENCOUNTER — Ambulatory Visit: Payer: BC Managed Care – PPO | Attending: Internal Medicine

## 2019-06-08 DIAGNOSIS — Z23 Encounter for immunization: Secondary | ICD-10-CM

## 2019-06-08 NOTE — Progress Notes (Signed)
   Covid-19 Vaccination Clinic  Name:  April Mccarty    MRN: [id number] DOB: Mar 15, 1965  06/08/2019  [name] was observed post Covid-19 immunization for 15 minutes without incident. She was provided with Vaccine Information Sheet and instruction to access the V-Safe system.   [name] was instructed to call 911 with any severe reactions post vaccine: Marland Kitchen Difficulty breathing  . Swelling of face and throat  . A fast heartbeat  . A bad rash all over body  . Dizziness and weakness   Immunizations Administered    Name Date Dose VIS Date Route   Pfizer COVID-19 Vaccine 06/08/2019  2:52 PM 0.3 mL 03/18/2018 Intramuscular   Manufacturer: [employer], [employer]   Lot: [id number]   NDC: [address]-9

## 2019-06-09 ENCOUNTER — Other Ambulatory Visit (HOSPITAL_COMMUNITY)
Admission: RE | Admit: 2019-06-09 | Discharge: 2019-06-09 | Disposition: A | Discharge status: Home / Self Care | Payer: BC Managed Care – PPO | Source: Ambulatory Visit | Attending: Family Medicine | Admitting: Family Medicine

## 2019-06-09 DIAGNOSIS — Z0000 Encounter for general adult medical examination without abnormal findings: Secondary | ICD-10-CM | POA: Insufficient documentation

## 2019-06-11 LAB — CYTOLOGY - PAP
Adequacy: ABSENT
Diagnosis: NEGATIVE

## 2019-06-29 ENCOUNTER — Ambulatory Visit: Payer: BC Managed Care – PPO

## 2019-07-06 ENCOUNTER — Ambulatory Visit: Payer: BC Managed Care – PPO | Attending: Internal Medicine

## 2019-07-06 DIAGNOSIS — Z23 Encounter for immunization: Secondary | ICD-10-CM

## 2019-07-06 NOTE — Progress Notes (Signed)
   Covid-19 Vaccination Clinic  Name:  April Mccarty    MRN: [id number] DOB: [birth year]  07/06/2019  April Mccarty was observed post Covid-19 immunization for 15 minutes without incident. She was provided with Vaccine Information Sheet and instruction to access the V-Safe system.   April Mccarty was instructed to call 911 with any severe reactions post vaccine: Marland Kitchen Difficulty breathing  . Swelling of face and throat  . A fast heartbeat  . A bad rash all over body  . Dizziness and weakness   Immunizations Administered    Name Date Dose VIS Date Route   Pfizer COVID-19 Vaccine 07/06/2019  9:03 AM 0.3 mL 03/18/2018 Intramuscular   Manufacturer: [employer], [employer]   Lot: [id number]   NDC: [address]-7

## 2019-07-10 ENCOUNTER — Encounter: Payer: Self-pay | Admitting: Podiatry

## 2019-07-10 ENCOUNTER — Other Ambulatory Visit: Payer: Self-pay

## 2019-07-10 ENCOUNTER — Ambulatory Visit: Payer: BC Managed Care – PPO | Admitting: Podiatry

## 2019-07-10 ENCOUNTER — Ambulatory Visit (INDEPENDENT_AMBULATORY_CARE_PROVIDER_SITE_OTHER): Payer: BC Managed Care – PPO

## 2019-07-10 DIAGNOSIS — L600 Ingrowing nail: Secondary | ICD-10-CM | POA: Diagnosis not present

## 2019-07-10 DIAGNOSIS — M2041 Other hammer toe(s) (acquired), right foot: Secondary | ICD-10-CM

## 2019-07-10 NOTE — Patient Instructions (Signed)

## 2019-07-10 NOTE — Progress Notes (Signed)
Dg rig 

## 2019-07-12 NOTE — Progress Notes (Signed)
Subjective:   Patient ID: April Mccarty, female   DOB: 54 y.o.   MRN: [id number]   HPI Patient presents with 2 different problems with 1 being a chronic ingrown toenail of the left big toe that is very sore and she is tried to trim and soak and secondarily elevation of the second digit right that can be bothersome for her along with the third toe coming under it and states shoe gear can be uncomfortable and she is tried wider shoe gear she is tried other materials without relief along with soaks.  Patient does not smoke likes to be active   Review of Systems  All other systems reviewed and are negative.       Objective:  Physical Exam Vitals and nursing note reviewed.  Constitutional:      Appearance: She is well-developed.  Pulmonary:     Effort: Pulmonary effort is normal.  Musculoskeletal:        General: Normal range of motion.  Skin:    General: Skin is warm.  Neurological:     Mental Status: She is alert.     Neurovascular status intact muscle strength found to be adequate range of motion within normal limits.  Patient is noted to have a incurvated medial border left hallux that is painful when pressed with no active redness or drainage noted and on the right second and third toes there is elevation of the digits with dorsal irritation of the tissue and bone.  Patient states it is gradually been getting worse on the right foot over time and has good digital perfusion well oriented x3    Assessment:  Ingrown toenail deformity left hallux medial border with pain along with hammertoe deformity second and third digits right foot with elevation     Plan:  H&P reviewed conditions.  I do think the nail should be corrected due to long-term pain and she wants this done and I allowed her to read and then signed consent form.  I infiltrated the left hallux 60 mg like Marcaine mixture sterile prep applied and using sterile instrumentation I remove the medial border exposed matrix  and applied phenol 3 applications 30 seconds followed by alcohol lavage sterile dressing.  Gave instructions on soaks and to leave dressing on 24 hours but take it off earlier if any throbbing were to occur and encouraged to call with questions.  For the right foot discussed digital fusion of digits 2 3 right foot and I educated her on this and she would like to do this but will probably wait till the fall  X-rays indicate that there is elevation of the second and third toes right foot proximal distal deformity of the right third toe

## 2019-07-15 ENCOUNTER — Other Ambulatory Visit: Payer: Self-pay

## 2019-07-15 ENCOUNTER — Ambulatory Visit (INDEPENDENT_AMBULATORY_CARE_PROVIDER_SITE_OTHER): Payer: BC Managed Care – PPO | Admitting: Podiatry

## 2019-07-15 DIAGNOSIS — L600 Ingrowing nail: Secondary | ICD-10-CM

## 2019-07-15 MED ORDER — DOXYCYCLINE HYCLATE 100 MG PO TABS: 100.0000 mg | ORAL_TABLET | Freq: Two times a day (BID) | ORAL | 0 refills | Status: DC

## 2019-07-15 MED ORDER — GENTAMICIN SULFATE 0.1 % EX CREA: 1.0000 "application " | TOPICAL_CREAM | Freq: Two times a day (BID) | CUTANEOUS | 1 refills | Status: DC

## 2019-07-15 NOTE — Progress Notes (Signed)
° °  Subjective: 54 y.o. female presents today status post permanent nail avulsion procedure of the medial border of the left hallux that was performed on 07/10/2019.  Patient came in today because she was concerned for redness and swelling to the nail avulsion site.  She has not been able to soak the foot as instructed and she has not been applying anything to the nail avulsion area.  She continues to have some pain and tenderness to the area.  She presents for follow-up treatment evaluation  Past Medical History:  Diagnosis Date   Anxiety    Asthma    Back pain    Depression    Environmental allergies    Hypertension    Sleep apnea    cpap - does not know settings    URI (upper respiratory infection)     Objective: Skin is warm, dry and supple. Nail and respective nail fold appears to be healing appropriately despite the erythema and edema around the base of the nail and periungual region. Open wound to the associated nail fold with a granular wound base and moderate amount of fibrotic tissue. Minimal drainage noted. Mild erythema around the periungual region likely due to phenol chemical matricectomy.  Assessment: #1 postop permanent partial nail avulsion medial border left hallux #2 open wound periungual nail fold of respective digit.   Plan of care: #1 patient was evaluated  #2  I explained to the patient that swelling with redness localized around the nail avulsion site is normal secondary to the chemical phenol.  #3  I did call in a prescription for doxycycline 100 mg #20 #4 prescription for gentamicin cream.  Recommend gentamicin cream and a Band-Aid daily #5 recommend daily Epsom salt soaks #6 return to clinic in 2 weeks   Felecia Shelling, DPM [facility]  Dr. Felecia Shelling, DPM    [address]                                        [place], [place] [address]                Office [phone]  Fax [phone]

## 2019-08-05 ENCOUNTER — Ambulatory Visit: Payer: BC Managed Care – PPO | Admitting: Podiatry

## 2019-08-05 ENCOUNTER — Encounter: Payer: Self-pay | Admitting: Podiatry

## 2019-08-05 ENCOUNTER — Other Ambulatory Visit: Payer: Self-pay

## 2019-08-05 DIAGNOSIS — L03032 Cellulitis of left toe: Secondary | ICD-10-CM

## 2019-08-05 NOTE — Progress Notes (Signed)
Subjective:   Patient ID: April Mccarty, female   DOB: 54 y.o.   MRN: [id number]   HPI Patient presents stating it still crusting and I had a little bit of drainage and I want my toe checked.  Points to the big toe left that was fixed about 4 weeks ago   ROS      Objective:  Physical Exam  Neurovascular status intact with patient found to have crusted tissue on the left hallux medial side with localized redness with an area that was cleaned out once and is still moderately irritative     Assessment:  Low-grade paronychia of the left hallux medial side     Plan:  Reviewed condition and discussed paronychia clean the area out advised on soaks we are not going to pursue antibiotics today but we may need to do this again in future and if any increase in redness and increasing drainage or other issues should occur to let us know immediately but I am hoping that this will heal uneventfully

## 2021-08-18 ENCOUNTER — Other Ambulatory Visit: Payer: Self-pay | Admitting: Family Medicine

## 2021-08-18 DIAGNOSIS — Z01411 Encounter for gynecological examination (general) (routine) with abnormal findings: Secondary | ICD-10-CM

## 2021-08-18 DIAGNOSIS — N950 Postmenopausal bleeding: Secondary | ICD-10-CM

## 2021-08-31 ENCOUNTER — Ambulatory Visit
Admission: RE | Admit: 2021-08-31 | Discharge: 2021-08-31 | Disposition: A | Discharge status: Home / Self Care | Payer: No Typology Code available for payment source | Source: Ambulatory Visit | Attending: Family Medicine | Admitting: Family Medicine

## 2021-08-31 DIAGNOSIS — Z01411 Encounter for gynecological examination (general) (routine) with abnormal findings: Secondary | ICD-10-CM

## 2021-08-31 DIAGNOSIS — N950 Postmenopausal bleeding: Secondary | ICD-10-CM

## 2021-10-12 DIAGNOSIS — R9389 Abnormal findings on diagnostic imaging of other specified body structures: Secondary | ICD-10-CM | POA: Diagnosis not present

## 2021-10-12 DIAGNOSIS — N83202 Unspecified ovarian cyst, left side: Secondary | ICD-10-CM | POA: Diagnosis not present

## 2021-10-12 DIAGNOSIS — N950 Postmenopausal bleeding: Secondary | ICD-10-CM | POA: Diagnosis not present

## 2021-11-09 DIAGNOSIS — R69 Illness, unspecified: Secondary | ICD-10-CM | POA: Diagnosis not present

## 2021-11-09 DIAGNOSIS — I10 Essential (primary) hypertension: Secondary | ICD-10-CM | POA: Diagnosis not present

## 2021-12-04 DIAGNOSIS — R9389 Abnormal findings on diagnostic imaging of other specified body structures: Secondary | ICD-10-CM | POA: Diagnosis not present

## 2021-12-04 DIAGNOSIS — N950 Postmenopausal bleeding: Secondary | ICD-10-CM | POA: Diagnosis not present

## 2022-02-25 NOTE — H&P (Signed)
April Mccarty is an 57 y.o. postmenopausal G2P2 s/p BTL who is admitted for Hysteroscopy D&C with possible myosure polypectomy for PMB and thickened endometrium.  Pt has been in menopause for over 3 years. Pt reports that she had very heavy bleeding that lasted for 7 days and had a another recent episode of bleeding similar to a menstrual cycle just prior to her pre-operative visit. She states she had been having lower left back pain similar to what she experienced before starting her cycle.         Work-up: Pap smear (08/17/2021): NILM/HRHPV negative, endometrial cells present  EMB (10/12/2021): Scant fragments of proliferative endometrium, no hyperplasia/malignancy  TVUS (08/31/2021):      CLINICAL DATA:  Initial evaluation for postmenopausal bleeding.   EXAM: TRANSABDOMINAL AND TRANSVAGINAL ULTRASOUND OF PELVIS   TECHNIQUE: Both transabdominal and transvaginal ultrasound examinations of the pelvis were performed. Transabdominal technique was performed for global imaging of the pelvis including uterus, ovaries, adnexal regions, and pelvic cul-de-sac. It was necessary to proceed with endovaginal exam following the transabdominal exam to visualize the endometrium.   COMPARISON:  CT from 07/15/2012.   FINDINGS: Uterus   Measurements: 8.1 x 4.0 x 4.1 cm = volume: 68.0 mL. Uterus is anteverted. No discrete fibroid or other myometrial abnormality.   Endometrium   Thickness: 21 mm. Endometrial complex somewhat heterogeneous with a few possible small foci of internal vascularity (image 23). No other focal abnormality.   Right ovary   Not visualized.  No adnexal mass.   Left ovary   Measurements: 2.1 x 1.0 x 2.0 cm = volume: 2.0 mL. 8 x 6 x 6 mm simple cyst. No other adnexal mass.   Other findings   No abnormal free fluid.   IMPRESSION: 1. Endometrial stripe abnormally thickened up to 21 mm with a few possible small foci of internal vascularity. In the setting  of post-menopausal bleeding, endometrial sampling is indicated to exclude carcinoma. If results are benign, sonohysterogram should be considered for focal lesion work-up. (Ref: Radiological Reasoning: Algorithmic Workup of Abnormal Vaginal Bleeding with Endovaginal Sonography and Sonohysterography. AJR [id number]). 2. 8 mm simple left ovarian cyst. This is almost certainly benign given size and appearance, with no followup imaging recommended Note: This recommendation does not apply to premenarchal patients or to those with increased risk (genetic, family history, elevated tumor markers or other high-risk factors) of ovarian cancer. Reference: Radiology 2019 Nov; 293(2):359-371. 3. Nonvisualization of the right ovary. No other adnexal mass or free fluid. 4. Normal sonographic appearance of the uterus.     Electronically Signed   By: Jeannine Boga M.D.   On: 08/31/2021 20:11  Patient Active Problem List   Diagnosis Date Noted   Osteoarthritis of acromioclavicular joint 06/17/2018   Adhesive capsulitis of left shoulder 03/06/2018   Pain in joint of left shoulder 02/26/2018   Chronic back pain 08/06/2017   Pain in left knee 08/06/2017   Ventral hernia 05/07/2012   MEDICAL/FAMILY/SOCIAL HX: No LMP recorded.    Past Medical History:  Diagnosis Date   Anxiety    Asthma    Back pain    Depression    Environmental allergies    Hypertension    Sleep apnea    cpap - does not know settings    URI (upper respiratory infection)     Past Surgical History:  Procedure Laterality Date   CHOLECYSTECTOMY  2008   TONSILLECTOMY  1984   TUBAL LIGATION     VENTRAL HERNIA REPAIR  N/A 05/20/2012   Procedure: LAPAROSCOPIC VENTRAL HERNIA;  Surgeon: Shann Medal, MD;  Location: [facility];  Service: General;  Laterality: N/A;    Family History  Problem Relation Age of Onset   Diabetes Mother     Social History:  reports that she quit smoking about 30 years ago. She has never  used smokeless tobacco. She reports that she does not drink alcohol and does not use drugs.  ALLERGIES/MEDS:  Allergies:  Allergies  Allergen Reactions   Sulfa Antibiotics Other (See Comments)    unknown   Tessalon [Benzonatate] Cough    No medications prior to admission.     Review of Systems  Constitutional: Negative.   HENT: Negative.    Eyes: Negative.   Respiratory: Negative.    Cardiovascular: Negative.   Gastrointestinal: Negative.   Genitourinary: Negative.   Musculoskeletal: Negative.   Skin: Negative.   Neurological: Negative.   Endo/Heme/Allergies: Negative.   Psychiatric/Behavioral: Negative.      There were no vitals taken for this visit. Gen:  NAD, pleasant and cooperative Cardio:  RRR Pulm:  CTAB, no wheezes/rales/rhonchi Abd:  Soft, non-distended, non-tender throughout, no rebound/guarding Ext:  No bilateral LE edema, no bilateral calf tenderness   No results found for this or any previous visit (from the past 24 hour(s)).  No results found.   ASSESSMENT/PLAN: April Mccarty is a 57 y.o. postmenopausal G2P2 s/p BTL who is admitted for Hysteroscopy D&C with possible myosure polypectomy for PMB and thickened endometrium with some areas of bloodflow within.  - Admit to [facility] labs (CBC, T&S, COVID screen) - Diet:  Per anesthesia/ERAS pathway - IVF:  Per anesthesia - VTE Prophylaxis:  SCDs - Antibiotics: None - D/C home same-day  Consents: I discussed with the patient that this surgery is performed to look inside the uterus and remove the uterine lining.  Prior to surgery, the risks and benefits of the surgery, as well as alternative treatments, have been discussed.  The risks include, but are not limited to bleeding, including the need for a blood transfusion, infection, damage to organs and tissues, including uterine perforation, requiring additional surgery, postoperative pain, short-term and long-term, failure of the procedure  to control symptoms, need for hysterectomy to control bleeding, fluid overload, which could create electrolyte abnormalities and the need to stop the procedure before completion, inability to safely complete the procedure, deep vein thrombosis and/or pulmonary embolism, painful intercourse, complications the course of which cannot be predicted or prevented, and death.  Patient was consented for blood products.  The patient is aware that bleeding may result in the need for a blood transfusion which includes risk of transmission of HIV (1:2 million), Hepatitis C (1:2 million), and Hepatitis B (1:200 thousand) and transfusion reaction.  Patient voiced understanding of the above risks as well as understanding of indications for blood transfusion.   [name], DO

## 2022-03-01 DIAGNOSIS — R9389 Abnormal findings on diagnostic imaging of other specified body structures: Secondary | ICD-10-CM | POA: Diagnosis not present

## 2022-03-01 DIAGNOSIS — N950 Postmenopausal bleeding: Secondary | ICD-10-CM | POA: Diagnosis not present

## 2022-03-01 DIAGNOSIS — Z01818 Encounter for other preprocedural examination: Secondary | ICD-10-CM | POA: Diagnosis not present

## 2022-03-13 ENCOUNTER — Other Ambulatory Visit: Payer: Self-pay

## 2022-03-13 ENCOUNTER — Encounter (HOSPITAL_COMMUNITY): Payer: Self-pay | Admitting: Obstetrics and Gynecology

## 2022-03-13 NOTE — Anesthesia Preprocedure Evaluation (Signed)
Anesthesia Evaluation  Patient identified by MRN, date of birth, ID band Patient awake    Reviewed: Allergy & Precautions, NPO status , Patient's Chart, lab work & pertinent test results  History of Anesthesia Complications (+) PONV and history of anesthetic complications  Airway Mallampati: II  TM Distance: >3 FB Neck ROM: Full    Dental no notable dental hx. (+) Teeth Intact, Dental Advisory Given   Pulmonary sleep apnea and Continuous Positive Airway Pressure Ventilation , Current Smoker and Patient abstained from smoking.   Pulmonary exam normal breath sounds clear to auscultation       Cardiovascular hypertension, Normal cardiovascular exam Rhythm:Regular Rate:Normal     Neuro/Psych   Anxiety Depression       GI/Hepatic   Endo/Other    Renal/GU Lab Results      Component                Value               Date                      WBC                      6.9                 03/14/2022                HGB                      13.1                03/14/2022                HCT                      42.9                03/14/2022                MCV                      94.1                03/14/2022                PLT                      231                 03/14/2022                Musculoskeletal  (+) Arthritis ,    Abdominal   Peds  Hematology Lab Results      Component                Value               Date                      WBC                      6.9                 03/14/2022                HGB  13.1                03/14/2022                HCT                      42.9                03/14/2022                MCV                      94.1                03/14/2022                PLT                      231                 03/14/2022              Anesthesia Other Findings All: Sulfa, HCTZ, tesselon  Reproductive/Obstetrics                              Anesthesia Physical Anesthesia Plan  ASA: 3  Anesthesia Plan: General   Post-op Pain Management: Tylenol PO (pre-op)* and Toradol IV (intra-op)*   Induction:   PONV Risk Score and Plan: Treatment may vary due to age or medical condition, Midazolam and Ondansetron  Airway Management Planned: LMA  Additional Equipment: None  Intra-op Plan:   Post-operative Plan: Extubation in OR  Informed Consent: I have reviewed the patients History and Physical, chart, labs and discussed the procedure including the risks, benefits and alternatives for the proposed anesthesia with the patient or authorized representative who has indicated his/her understanding and acceptance.     Dental advisory given  Plan Discussed with:   Anesthesia Plan Comments: (PAT note by Karoline Caldwell, PA-C: 57 yo female for D&C hysteroscopy 03/14/22 with Dr. Delora Fuel.   Patient reported a history of chronic atypical chest pain. States it has been stable for many years, not associated with exertion. Possibly associated with anxiety and she reports she was recently started on buspirone. She did have evaluation of this pain remotely in 2008. She was admitted at [facility] that time with complaint of left sided chest pain. She ruled out for ACS with serial cardiac markers as well as a negative Cardiolite with showed normal perfusion and EF of 58%. She states she has continued to have similar episodes of chest tightness, typically during times of stress. Her symptoms are not reliably elicited by exertion. She denies any associated nausea, SOB, diaphoresis. She does have some baseline DOE secondary to deconditioning (BMI 42), but states she can go up two flights of stairs without chest pain. She states her PCP Dr. Shirline Frees is aware of her upcoming surgery.   OSA, not compliant with CPAP. States she had rpeviously lost a significant amount of weight in a weight loss research study and no longer required CPAP. However,  she has regained that weight over time but has not restarted use of CPAP.  Prediabetic, states this is monitored closely by her PCP. Not on any anti DM medications.  Current every day smoker.   HTN, maintained on lisinopril.  Last EKG available for review from 05/03/2014 shows sinus  bradycardia at a rate of 53 bpm.  Reviewed history with anesthesiologist Dr. Therisa Doyne. He advised okay to proceed as planned. She will have DOS labs and evaluation.   )        Anesthesia Quick Evaluation

## 2022-03-13 NOTE — Progress Notes (Signed)
April Mccarty denies shortness of breath.  Patient denies having any s/s of Covid in her household, also denies any known exposure to Covid.   April Mccarty states that she has chest pain almost everyday, especially at work.  Patient reports the pain is a tightness, patient denies shortness of breath, lightheadedness, n/v.  April Mccarty is not aware of anything she does that causes the tightness to start or stop.  April Mccarty said, it may last 5 hours, patient does not take anything for it. I asked April Caldwell, PA-C to review.  PCP is Dr, Shirline Frees.

## 2022-03-13 NOTE — Progress Notes (Signed)
Anesthesia Chart Review:  57 yo female for D&C hysteroscopy 03/14/22 with Dr. Delora Fuel.   Patient reported a history of chronic atypical chest pain. States it has been stable for many years, not associated with exertion. Possibly associated with anxiety and she reports she was recently started on buspirone. She did have evaluation of this pain remotely in 2008. She was admitted at [facility] that time with complaint of left sided chest pain. She ruled out for ACS with serial cardiac markers as well as a negative Cardiolite with showed normal perfusion and EF of 58%. She states she has continued to have similar episodes of chest tightness, typically during times of stress. Her symptoms are not reliably elicited by exertion. She denies any associated nausea, SOB, diaphoresis. She does have some baseline DOE secondary to deconditioning (BMI 42), but states she can go up two flights of stairs without chest pain. She states her PCP Dr. Shirline Frees is aware of her upcoming surgery.   OSA, not compliant with CPAP. States she had rpeviously lost a significant amount of weight in a weight loss research study and no longer required CPAP. However, she has regained that weight over time but has not restarted use of CPAP.  Prediabetic, states this is monitored closely by her PCP. Not on any anti DM medications.  Current every day smoker.   HTN, maintained on lisinopril.  Last EKG available for review from 05/03/2014 shows sinus bradycardia at a rate of 53 bpm.  Reviewed history with anesthesiologist Dr. Therisa Doyne. He advised okay to proceed as planned. She will have DOS labs and evaluation.    Wynonia Musty [facility] Stay [facility]/Anesthesiology Phone [phone] 03/13/2022 4:44 PM

## 2022-03-14 ENCOUNTER — Ambulatory Visit (HOSPITAL_COMMUNITY): Payer: 59 | Admitting: Physician Assistant

## 2022-03-14 ENCOUNTER — Ambulatory Visit (HOSPITAL_BASED_OUTPATIENT_CLINIC_OR_DEPARTMENT_OTHER): Payer: 59 | Admitting: Physician Assistant

## 2022-03-14 ENCOUNTER — Encounter (HOSPITAL_COMMUNITY)
Admission: RE | Disposition: A | Discharge status: Home / Self Care | Payer: Self-pay | Source: Home / Self Care | Attending: Obstetrics and Gynecology

## 2022-03-14 ENCOUNTER — Ambulatory Visit (HOSPITAL_COMMUNITY)
Admission: RE | Admit: 2022-03-14 | Discharge: 2022-03-14 | Disposition: A | Discharge status: Home / Self Care | Payer: 59 | Attending: Obstetrics and Gynecology | Admitting: Obstetrics and Gynecology

## 2022-03-14 ENCOUNTER — Encounter (HOSPITAL_COMMUNITY): Payer: Self-pay | Admitting: Obstetrics and Gynecology

## 2022-03-14 ENCOUNTER — Other Ambulatory Visit: Payer: Self-pay

## 2022-03-14 DIAGNOSIS — G4730 Sleep apnea, unspecified: Secondary | ICD-10-CM | POA: Diagnosis not present

## 2022-03-14 DIAGNOSIS — G4733 Obstructive sleep apnea (adult) (pediatric): Secondary | ICD-10-CM

## 2022-03-14 DIAGNOSIS — I10 Essential (primary) hypertension: Secondary | ICD-10-CM | POA: Insufficient documentation

## 2022-03-14 DIAGNOSIS — Z91199 Patient's noncompliance with other medical treatment and regimen due to unspecified reason: Secondary | ICD-10-CM | POA: Diagnosis not present

## 2022-03-14 DIAGNOSIS — Z9989 Dependence on other enabling machines and devices: Secondary | ICD-10-CM | POA: Diagnosis not present

## 2022-03-14 DIAGNOSIS — F418 Other specified anxiety disorders: Secondary | ICD-10-CM | POA: Diagnosis not present

## 2022-03-14 DIAGNOSIS — R7303 Prediabetes: Secondary | ICD-10-CM | POA: Insufficient documentation

## 2022-03-14 DIAGNOSIS — N840 Polyp of corpus uteri: Secondary | ICD-10-CM

## 2022-03-14 DIAGNOSIS — R9389 Abnormal findings on diagnostic imaging of other specified body structures: Secondary | ICD-10-CM | POA: Diagnosis not present

## 2022-03-14 DIAGNOSIS — N950 Postmenopausal bleeding: Secondary | ICD-10-CM | POA: Insufficient documentation

## 2022-03-14 DIAGNOSIS — F17200 Nicotine dependence, unspecified, uncomplicated: Secondary | ICD-10-CM | POA: Diagnosis not present

## 2022-03-14 DIAGNOSIS — R69 Illness, unspecified: Secondary | ICD-10-CM | POA: Diagnosis not present

## 2022-03-14 HISTORY — PX: DILATATION & CURETTAGE/HYSTEROSCOPY WITH MYOSURE: SHX6511

## 2022-03-14 HISTORY — DX: Prediabetes: R73.03

## 2022-03-14 HISTORY — DX: Cardiac arrhythmia, unspecified: I49.9

## 2022-03-14 HISTORY — DX: Other specified postprocedural states: Z98.890

## 2022-03-14 HISTORY — DX: Other complications of anesthesia, initial encounter: T88.59XA

## 2022-03-14 LAB — BASIC METABOLIC PANEL
Anion gap: 9 (ref 5–15)
BUN: 14 mg/dL (ref 6–20)
CO2: 26 mmol/L (ref 22–32)
Calcium: 8.8 mg/dL — ABNORMAL LOW (ref 8.9–10.3)
Chloride: 102 mmol/L (ref 98–111)
Creatinine, Ser: 0.66 mg/dL (ref 0.44–1.00)
GFR, Estimated: >60 mL/min (ref >60)
Glucose, Bld: 108 mg/dL — ABNORMAL HIGH (ref 70–99)
Potassium: 4.2 mmol/L (ref 3.5–5.1)
Sodium: 137 mmol/L (ref 135–145)

## 2022-03-14 LAB — CBC
HCT: 42.9 % (ref 36.0–46.0)
Hemoglobin: 13.1 g/dL (ref 12.0–15.0)
MCH: 28.7 pg (ref 26.0–34.0)
MCHC: 30.5 g/dL (ref 30.0–36.0)
MCV: 94.1 fL (ref 80.0–100.0)
Platelets: 231 10*3/uL (ref 150–400)
RBC: 4.56 MIL/uL (ref 3.87–5.11)
RDW: 13.9 % (ref 11.5–15.5)
WBC: 6.9 10*3/uL (ref 4.0–10.5)
nRBC: 0 % (ref 0.0–0.2)

## 2022-03-14 LAB — TYPE AND SCREEN
ABO/RH(D): A POS
Antibody Screen: NEGATIVE

## 2022-03-14 LAB — POCT PREGNANCY, URINE: Preg Test, Ur: NEGATIVE

## 2022-03-14 LAB — ABO/RH: ABO/RH(D): A POS

## 2022-03-14 SURGERY — DILATATION & CURETTAGE/HYSTEROSCOPY WITH MYOSURE
Anesthesia: General | Site: Uterus

## 2022-03-14 MED ORDER — LACTATED RINGERS IV SOLN: INTRAVENOUS | Status: DC

## 2022-03-14 MED ORDER — KETOROLAC TROMETHAMINE 30 MG/ML IJ SOLN
30.0000 mg | Freq: Once | INTRAMUSCULAR | Status: AC | PRN
  Administered 2022-03-14: 30 mg via INTRAVENOUS

## 2022-03-14 MED ORDER — ONDANSETRON HCL 4 MG/2ML IJ SOLN: 4.0000 mg | Freq: Once | INTRAMUSCULAR | Status: DC | PRN

## 2022-03-14 MED ORDER — SILVER NITRATE-POT NITRATE 75-25 % EX MISC
CUTANEOUS | Status: DC | PRN
  Administered 2022-03-14: 6

## 2022-03-14 MED ORDER — ORAL CARE MOUTH RINSE: 15.0000 mL | Freq: Once | OROMUCOSAL | Status: AC

## 2022-03-14 MED ORDER — LIDOCAINE 2% (20 MG/ML) 5 ML SYRINGE
INTRAMUSCULAR | Status: DC | PRN
  Administered 2022-03-14: 100 mg via INTRAVENOUS

## 2022-03-14 MED ORDER — SODIUM CHLORIDE 0.9 % IR SOLN
Status: DC | PRN
  Administered 2022-03-14: 3000 mL

## 2022-03-14 MED ORDER — HYDROMORPHONE HCL 1 MG/ML IJ SOLN: 0.2500 mg | INTRAMUSCULAR | Status: DC | PRN

## 2022-03-14 MED ORDER — PROPOFOL 10 MG/ML IV BOLUS
INTRAVENOUS | Status: DC | PRN
  Administered 2022-03-14: 200 mg via INTRAVENOUS

## 2022-03-14 MED ORDER — DEXAMETHASONE SODIUM PHOSPHATE 10 MG/ML IJ SOLN
INTRAMUSCULAR | Status: DC | PRN
  Administered 2022-03-14: 10 mg via INTRAVENOUS

## 2022-03-14 MED ORDER — FENTANYL CITRATE (PF) 250 MCG/5ML IJ SOLN
INTRAMUSCULAR | Status: AC
  Filled 2022-03-14: qty 5

## 2022-03-14 MED ORDER — MIDAZOLAM HCL 2 MG/2ML IJ SOLN
INTRAMUSCULAR | Status: DC | PRN
  Administered 2022-03-14: 2 mg via INTRAVENOUS

## 2022-03-14 MED ORDER — IBUPROFEN 800 MG PO TABS: 800.0000 mg | ORAL_TABLET | Freq: Three times a day (TID) | ORAL | 1 refills | Status: AC | PRN

## 2022-03-14 MED ORDER — MIDAZOLAM HCL 2 MG/2ML IJ SOLN
INTRAMUSCULAR | Status: AC
  Filled 2022-03-14: qty 2

## 2022-03-14 MED ORDER — ONDANSETRON HCL 4 MG/2ML IJ SOLN
INTRAMUSCULAR | Status: DC | PRN
  Administered 2022-03-14: 4 mg via INTRAVENOUS

## 2022-03-14 MED ORDER — FENTANYL CITRATE (PF) 250 MCG/5ML IJ SOLN
INTRAMUSCULAR | Status: DC | PRN
  Administered 2022-03-14: 50 ug via INTRAVENOUS

## 2022-03-14 MED ORDER — OXYCODONE HCL 5 MG/5ML PO SOLN: 5.0000 mg | Freq: Once | ORAL | Status: DC | PRN

## 2022-03-14 MED ORDER — CHLORHEXIDINE GLUCONATE 0.12 % MT SOLN
15.0000 mL | Freq: Once | OROMUCOSAL | Status: AC
  Administered 2022-03-14: 15 mL via OROMUCOSAL
  Filled 2022-03-14: qty 15

## 2022-03-14 MED ORDER — OXYCODONE HCL 5 MG PO TABS: 5.0000 mg | ORAL_TABLET | Freq: Once | ORAL | Status: DC | PRN

## 2022-03-14 MED ORDER — KETOROLAC TROMETHAMINE 30 MG/ML IJ SOLN
INTRAMUSCULAR | Status: AC
  Filled 2022-03-14: qty 1

## 2022-03-14 SURGICAL SUPPLY — 17 items
CATH ROBINSON RED A/P 16FR (CATHETERS) ×1 IMPLANT
CNTNR URN SCR LID CUP LEK RST (MISCELLANEOUS) ×1 IMPLANT
CONT SPEC 4OZ STRL OR WHT (MISCELLANEOUS) ×1
DEVICE MYOSURE LITE (MISCELLANEOUS) IMPLANT
DEVICE MYOSURE REACH (MISCELLANEOUS) IMPLANT
DILATOR CANAL MILEX (MISCELLANEOUS) IMPLANT
GLOVE BIO SURGEON STRL SZ 6.5 (GLOVE) ×1 IMPLANT
GLOVE BIOGEL PI IND STRL 7.0 (GLOVE) ×2 IMPLANT
GOWN STRL REUS W/ TWL LRG LVL3 (GOWN DISPOSABLE) ×2 IMPLANT
GOWN STRL REUS W/TWL LRG LVL3 (GOWN DISPOSABLE) ×2
KIT PROCEDURE FLUENT (KITS) ×1 IMPLANT
KIT TURNOVER KIT B (KITS) ×1 IMPLANT
PACK VAGINAL MINOR WOMEN LF (CUSTOM PROCEDURE TRAY) ×1 IMPLANT
PAD OB MATERNITY 4.3X12.25 (PERSONAL CARE ITEMS) ×1 IMPLANT
SEAL ROD LENS SCOPE MYOSURE (ABLATOR) ×1 IMPLANT
TOWEL GREEN STERILE FF (TOWEL DISPOSABLE) ×1 IMPLANT
UNDERPAD 30X36 HEAVY ABSORB (UNDERPADS AND DIAPERS) ×1 IMPLANT

## 2022-03-14 NOTE — Interval H&P Note (Signed)
History and Physical Interval Note:  03/14/2022 10:26 AM  April Mccarty  has presented today for surgery, with the diagnosis of Thickened Endometrium.  The various methods of treatment have been discussed with the patient and family. After consideration of risks, benefits and other options for treatment, the patient has consented to  Procedure(s): [name] (N/A) as a surgical intervention.  The patient's history has been reviewed, patient examined, no change in status, stable for surgery.  I have reviewed the patient's chart and labs.  Questions were answered to the patient's satisfaction.     [name]

## 2022-03-14 NOTE — Anesthesia Procedure Notes (Signed)
Procedure Name: Intubation Date/Time: 03/14/2022 11:10 AM  Performed by: Darletta Moll, CRNAPre-anesthesia Checklist: Patient identified, Emergency Drugs available, Suction available and Patient being monitored Patient Re-evaluated:Patient Re-evaluated prior to induction Oxygen Delivery Method: Circle system utilized Preoxygenation: Pre-oxygenation with 100% oxygen Induction Type: IV induction Ventilation: Mask ventilation without difficulty LMA: LMA inserted LMA Size: 4.0 Number of attempts: 1 Airway Equipment and Method: Oral airway Placement Confirmation: positive ETCO2, breath sounds checked- equal and bilateral and CO2 detector Tube secured with: Tape Dental Injury: Teeth and Oropharynx as per pre-operative assessment

## 2022-03-14 NOTE — Anesthesia Postprocedure Evaluation (Signed)
Anesthesia Post Note  Patient: April Mccarty  Procedure(s) Performed: DILATATION & CURETTAGE/HYSTEROSCOPY WITH MYOSURE (Uterus)     Patient location during evaluation: PACU Anesthesia Type: General Level of consciousness: awake and alert Pain management: pain level controlled Vital Signs Assessment: post-procedure vital signs reviewed and stable Respiratory status: spontaneous breathing, nonlabored ventilation, respiratory function stable and patient connected to nasal cannula oxygen Cardiovascular status: blood pressure returned to baseline and stable Postop Assessment: no apparent nausea or vomiting Anesthetic complications: no  No notable events documented.  Last Vitals:  Vitals:   03/14/22 1200 03/14/22 1215  BP: (!) 155/83 (!) 151/84  Pulse: 61 60  Resp: (!) 21 20  Temp:  36.5 C  SpO2: 96% 96%    Last Pain:  Vitals:   03/14/22 1215  TempSrc:   PainSc: 0-No pain                 Barnet Glasgow

## 2022-03-14 NOTE — Transfer of Care (Signed)
Immediate Anesthesia Transfer of Care Note  Patient: April Mccarty  Procedure(s) Performed: DILATATION & CURETTAGE/HYSTEROSCOPY WITH MYOSURE (Uterus)  Patient Location: PACU  Anesthesia Type:General  Level of Consciousness: drowsy and patient cooperative  Airway & Oxygen Therapy: Patient Spontanous Breathing  Post-op Assessment: Report given to RN, Post -op Vital signs reviewed and stable, and Patient moving all extremities X 4  Post vital signs: Reviewed and stable  Last Vitals:  Vitals Value Taken Time  BP 156/74 03/14/22 1145  Temp    Pulse 63 03/14/22 1146  Resp 20 03/14/22 1146  SpO2 97 % 03/14/22 1146  Vitals shown include unvalidated device data.  Last Pain:  Vitals:   03/14/22 0928  TempSrc:   PainSc: 0-No pain      Patients Stated Pain Goal: 0 ([id number])  Complications: No notable events documented.

## 2022-03-14 NOTE — Op Note (Signed)
Pre Op Dx:   1. Postmenopausal bleeding 2. Thickened endometrium on ultrasound (19m)  Post Op Dx:   1. Postmenopausal bleeding 2. Thickened endometrium on ultrasound (272m 3. Endometrial polyp  Procedure:   Hysteroscopy with Dilation and Curettage with Myosure polypectomy   Surgeon:  Dr. MeDrema Dallasssistants:  None Anesthesia:  LMA   EBL:  10cc  IVF:  See anesthesia documentation UOP:  200cc via in-and-out catheter Fluid Deficit:  25cc   Drains:  In-and-out foley catheter Specimen removed:  Endometrial curettings and endometrial polyp - sent to pathology Device(s) implanted: None Case Type:  Clean-contaminated Findings:  Normal-appearing cervix and endocervical canal. Endometrium atrophic-appearing. Large polyp noted on the left endometrial side wall. Bilateral tubal ostia visualized. Complications: None Indications:  [age].o. postmenopausal G2P2 with PMB and thickened endometrium on ultrasound.  Description of each procedure:  After informed consent was obtained the patient was taken to the operating room in the dorsal supine position.  After administration of general anesthesia, the patient was placed in the dorsal lithotomy position and prepped and draped in the usual sterile fashion.  Her bladder was emptied using an in and out catheter.  A pre-operative time-out was completed.  The anterior lip of the cervix was grasped with a single-tooth tenaculum and the cervix was serially dilated to accommodate the hysteroscope.  The hysteroscope was advanced and the findings as above was noted. The Myosure Reach was used to resect the endometrial polyp and sample the endometrium. The single-tooth tenaculum was removed and its sites were made hemostatic with pressure and silver nitrate.  Adequate hemostasis was noted.  The patient was awakened and extubated and appeared to have tolerated the procedure well.  All counts were correct.  Disposition:  PACU  MeDrema DallasDO

## 2022-03-15 ENCOUNTER — Encounter (HOSPITAL_COMMUNITY): Payer: Self-pay | Admitting: Obstetrics and Gynecology

## 2022-03-15 LAB — SURGICAL PATHOLOGY

## 2022-03-22 DIAGNOSIS — F330 Major depressive disorder, recurrent, mild: Secondary | ICD-10-CM | POA: Diagnosis not present

## 2022-03-22 DIAGNOSIS — N3941 Urge incontinence: Secondary | ICD-10-CM | POA: Diagnosis not present

## 2022-03-29 DIAGNOSIS — N840 Polyp of corpus uteri: Secondary | ICD-10-CM | POA: Diagnosis not present

## 2022-04-22 DIAGNOSIS — F330 Major depressive disorder, recurrent, mild: Secondary | ICD-10-CM | POA: Diagnosis not present

## 2022-05-04 DIAGNOSIS — Z1211 Encounter for screening for malignant neoplasm of colon: Secondary | ICD-10-CM | POA: Diagnosis not present

## 2022-05-04 DIAGNOSIS — K5730 Diverticulosis of large intestine without perforation or abscess without bleeding: Secondary | ICD-10-CM | POA: Diagnosis not present

## 2022-05-04 DIAGNOSIS — D123 Benign neoplasm of transverse colon: Secondary | ICD-10-CM | POA: Diagnosis not present

## 2022-05-04 DIAGNOSIS — K648 Other hemorrhoids: Secondary | ICD-10-CM | POA: Diagnosis not present

## 2022-05-09 DIAGNOSIS — J45909 Unspecified asthma, uncomplicated: Secondary | ICD-10-CM | POA: Diagnosis not present

## 2022-05-31 DIAGNOSIS — Z1231 Encounter for screening mammogram for malignant neoplasm of breast: Secondary | ICD-10-CM | POA: Diagnosis not present

## 2022-08-01 ENCOUNTER — Encounter (HOSPITAL_BASED_OUTPATIENT_CLINIC_OR_DEPARTMENT_OTHER): Payer: Self-pay | Admitting: Emergency Medicine

## 2022-08-01 ENCOUNTER — Other Ambulatory Visit: Payer: Self-pay

## 2022-08-01 ENCOUNTER — Emergency Department (HOSPITAL_BASED_OUTPATIENT_CLINIC_OR_DEPARTMENT_OTHER): Payer: 59 | Admitting: Radiology

## 2022-08-01 ENCOUNTER — Emergency Department (HOSPITAL_BASED_OUTPATIENT_CLINIC_OR_DEPARTMENT_OTHER)
Admission: EM | Admit: 2022-08-01 | Discharge: 2022-08-01 | Disposition: A | Discharge status: Home / Self Care | Payer: 59 | Attending: Emergency Medicine | Admitting: Emergency Medicine

## 2022-08-01 DIAGNOSIS — R7309 Other abnormal glucose: Secondary | ICD-10-CM | POA: Diagnosis not present

## 2022-08-01 DIAGNOSIS — R0789 Other chest pain: Secondary | ICD-10-CM

## 2022-08-01 DIAGNOSIS — R079 Chest pain, unspecified: Secondary | ICD-10-CM | POA: Diagnosis not present

## 2022-08-01 DIAGNOSIS — R001 Bradycardia, unspecified: Secondary | ICD-10-CM | POA: Diagnosis not present

## 2022-08-01 LAB — CBC
HCT: 41 % (ref 36.0–46.0)
Hemoglobin: 13 g/dL (ref 12.0–15.0)
MCH: 29.2 pg (ref 26.0–34.0)
MCHC: 31.7 g/dL (ref 30.0–36.0)
MCV: 92.1 fL (ref 80.0–100.0)
Platelets: 230 10*3/uL (ref 150–400)
RBC: 4.45 MIL/uL (ref 3.87–5.11)
RDW: 14.4 % (ref 11.5–15.5)
WBC: 7.1 10*3/uL (ref 4.0–10.5)
nRBC: 0 % (ref 0.0–0.2)

## 2022-08-01 LAB — BASIC METABOLIC PANEL
Anion gap: 4 — ABNORMAL LOW (ref 5–15)
BUN: 18 mg/dL (ref 6–20)
CO2: 32 mmol/L (ref 22–32)
Calcium: 8.9 mg/dL (ref 8.9–10.3)
Chloride: 102 mmol/L (ref 98–111)
Creatinine, Ser: 0.78 mg/dL (ref 0.44–1.00)
GFR, Estimated: >60 mL/min (ref >60)
Glucose, Bld: 88 mg/dL (ref 70–99)
Potassium: 4.1 mmol/L (ref 3.5–5.1)
Sodium: 138 mmol/L (ref 135–145)

## 2022-08-01 LAB — URINALYSIS, ROUTINE W REFLEX MICROSCOPIC
Bilirubin Urine: NEGATIVE
Glucose, UA: NEGATIVE mg/dL
Hgb urine dipstick: NEGATIVE
Ketones, ur: NEGATIVE mg/dL
Leukocytes,Ua: NEGATIVE
Nitrite: NEGATIVE
Protein, ur: NEGATIVE mg/dL
Specific Gravity, Urine: 1.02 (ref 1.005–1.030)
pH: 6.5 (ref 5.0–8.0)

## 2022-08-01 LAB — TROPONIN I (HIGH SENSITIVITY)
Troponin I (High Sensitivity): 2 ng/L (ref <18)
Troponin I (High Sensitivity): <2 ng/L (ref <18)

## 2022-08-01 LAB — CBG MONITORING, ED: Glucose-Capillary: 74 mg/dL (ref 70–99)

## 2022-08-01 LAB — TSH: TSH: 1.611 u[IU]/mL (ref 0.350–4.500)

## 2022-08-01 LAB — PREGNANCY, URINE: Preg Test, Ur: NEGATIVE

## 2022-08-01 NOTE — ED Notes (Signed)
Patient given discharge instructions. Questions were answered. Patient verbalized understanding of discharge instructions and care at home. ? ?Discharged with spouse ?

## 2022-08-01 NOTE — ED Provider Notes (Signed)
[place] EMERGENCY DEPARTMENT AT [facility] Provider Note   CSN: [id number] Arrival date & time: 08/01/22  1528     History {Add pertinent medical, surgical, social history, OB history to HPI:1} Chief Complaint  Patient presents with   Chest Pain    April Mccarty is a 57 y.o. female who presents emergency department for chest discomfort and near syncope.  Patient states that she has had some nausea and chest pressure worse with exertion.  She had associated left sided chest pressure radiating into the left arm and left side of the neck today with symptoms of lightheadedness.  She was seen at an urgent care earlier today and noted to be bradycardic and sent in for further evaluation.  Patient also reports recent weight gain and reflux symptoms.  She denies shortness of breath, unilateral leg swelling, hemoptysis, history of DVT or PE.   Chest Pain      Home Medications Prior to Admission medications   Medication Sig Start Date End Date Taking? Authorizing Provider  albuterol (VENTOLIN HFA) 108 (90 Base) MCG/ACT inhaler Inhale into the lungs every 6 (six) hours as needed for wheezing or shortness of breath. coughing    [provider]  ALPRAZolam (XANAX) 0.25 MG tablet Take 0.125-0.25 mg by mouth 3 (three) times daily as needed for anxiety.    [provider]  busPIRone (BUSPAR) 10 MG tablet Take 5 mg by mouth daily. 02/22/22   [provider]  escitalopram (LEXAPRO) 20 MG tablet Take 20 mg by mouth daily. 03/05/22   [provider]  ibuprofen (ADVIL) 800 MG tablet Take 1 tablet (800 mg total) by mouth every 8 (eight) hours as needed for moderate pain, cramping or mild pain. 03/14/22   Steva Ready, DO  lisinopril (ZESTRIL) 20 MG tablet Take 20 mg by mouth daily.    [provider]  ondansetron (ZOFRAN) 8 MG tablet Take 8 mg by mouth every 8 (eight) hours as needed for nausea or vomiting.    [provider]   traMADol (ULTRAM) 50 MG tablet Take 50 mg by mouth every evening.    [provider]      Allergies    Hydrochlorothiazide, Sulfa antibiotics, and Tessalon [benzonatate]    Review of Systems   Review of Systems  Cardiovascular:  Positive for chest pain.    Physical Exam Updated Vital Signs BP (!) 146/82 (BP Location: Right Arm)   Pulse (!) 51   Temp 98.3 F (36.8 C) (Oral)   Resp 19   Ht 5\' 5"  (1.651 m)   Wt 116.1 kg   SpO2 99%   BMI 42.60 kg/m  Physical Exam Vitals and nursing note reviewed.  Constitutional:      General: She is not in acute distress.    Appearance: She is well-developed. She is not diaphoretic.  HENT:     Head: Normocephalic and atraumatic.     Right Ear: External ear normal.     Left Ear: External ear normal.     Nose: Nose normal.     Mouth/Throat:     Mouth: Mucous membranes are moist.  Eyes:     General: No scleral icterus.    Conjunctiva/sclera: Conjunctivae normal.  Cardiovascular:     Rate and Rhythm: Normal rate and regular rhythm.     Heart sounds: Normal heart sounds. No murmur heard.    No friction rub. No gallop.  Pulmonary:     Effort: Pulmonary effort is normal. No respiratory distress.  Breath sounds: Normal breath sounds.  Abdominal:     General: Bowel sounds are normal. There is no distension.     Palpations: Abdomen is soft. There is no mass.     Tenderness: There is no abdominal tenderness. There is no guarding.  Musculoskeletal:     Cervical back: Normal range of motion.  Skin:    General: Skin is warm and dry.  Neurological:     Mental Status: She is alert and oriented to person, place, and time.  Psychiatric:        Behavior: Behavior normal.     ED Results / Procedures / Treatments   Labs (all labs ordered are listed, but only abnormal results are displayed) Labs Reviewed  BASIC METABOLIC PANEL - Abnormal; Notable for the following components:      Result Value   Anion gap 4 (*)    All other  components within normal limits  CBC  PREGNANCY, URINE  TROPONIN I (HIGH SENSITIVITY)    EKG None Sinus bradycardia Radiology DG Chest 2 View  Result Date: 08/01/2022 CLINICAL DATA:  Chest pain. EXAM: CHEST - 2 VIEW COMPARISON:  May 03, 2014. FINDINGS: The heart size and mediastinal contours are within normal limits. Both lungs are clear. The visualized skeletal structures are unremarkable. IMPRESSION: No active cardiopulmonary disease. Electronically Signed   By: Lupita Raider M.D.   On: 08/01/2022 16:30    Procedures Procedures  {Document cardiac monitor, telemetry assessment procedure when appropriate:1}  Medications Ordered in ED Medications - No data to display  ED Course/ Medical Decision Making/ A&P   {   Click here for ABCD2, HEART and other calculatorsREFRESH Note before signing :1}                          Medical Decision Making Amount and/or Complexity of Data Reviewed Labs: ordered. Radiology: ordered. ECG/medicine tests: ordered.   ***  {Document critical care time when appropriate:1} {Document review of labs and clinical decision tools ie heart score, Chads2Vasc2 etc:1}  {Document your independent review of radiology images, and any outside records:1} {Document your discussion with family members, caretakers, and with consultants:1} {Document social determinants of health affecting pt's care:1} {Document your decision making why or why not admission, treatments were needed:1} Final Clinical Impression(s) / ED Diagnoses Final diagnoses:  None    Rx / DC Orders ED Discharge Orders     None

## 2022-08-01 NOTE — ED Triage Notes (Signed)
Patient arrives ambulatory by POV c/o chest pressure to left side radiating into left arm onset of this morning while at work.

## 2022-08-01 NOTE — Discharge Instructions (Signed)
Contact a health care provider if: You feel light-headed or dizzy. You almost faint. You feel weak or are easily fatigued during physical activity. You experience confusion or have memory problems. Get help right away if: You faint. You have chest pains or an irregular heartbeat (palpitations). You have trouble breathing. These symptoms may represent a serious problem that is an emergency. Do not wait to see if the symptoms will go away. Get medical help right away. Call your local emergency services (911 in the [place] Do not drive yourself to the hospital. 

## 2022-08-01 NOTE — ED Notes (Addendum)
Orthostatic Vitals:  Lying flat-HR: 50, BP: 107/40 (MAP 61)  Sitting:  HR: 62, BP 140/73 (MAP 93) Standing: HR 58,  BP 135/66 (MAP 88)

## 2022-08-03 ENCOUNTER — Encounter (HOSPITAL_BASED_OUTPATIENT_CLINIC_OR_DEPARTMENT_OTHER): Payer: Self-pay | Admitting: Emergency Medicine

## 2022-08-03 ENCOUNTER — Emergency Department (HOSPITAL_BASED_OUTPATIENT_CLINIC_OR_DEPARTMENT_OTHER): Payer: 59 | Admitting: Radiology

## 2022-08-03 ENCOUNTER — Other Ambulatory Visit: Payer: Self-pay

## 2022-08-03 ENCOUNTER — Other Ambulatory Visit (HOSPITAL_BASED_OUTPATIENT_CLINIC_OR_DEPARTMENT_OTHER): Payer: Self-pay

## 2022-08-03 ENCOUNTER — Emergency Department (HOSPITAL_BASED_OUTPATIENT_CLINIC_OR_DEPARTMENT_OTHER)
Admission: EM | Admit: 2022-08-03 | Discharge: 2022-08-03 | Disposition: A | Discharge status: Home / Self Care | Payer: 59 | Attending: Emergency Medicine | Admitting: Emergency Medicine

## 2022-08-03 ENCOUNTER — Emergency Department (HOSPITAL_BASED_OUTPATIENT_CLINIC_OR_DEPARTMENT_OTHER): Payer: 59

## 2022-08-03 DIAGNOSIS — Z9049 Acquired absence of other specified parts of digestive tract: Secondary | ICD-10-CM | POA: Diagnosis not present

## 2022-08-03 DIAGNOSIS — R0789 Other chest pain: Secondary | ICD-10-CM | POA: Diagnosis not present

## 2022-08-03 DIAGNOSIS — R079 Chest pain, unspecified: Secondary | ICD-10-CM | POA: Insufficient documentation

## 2022-08-03 DIAGNOSIS — R110 Nausea: Secondary | ICD-10-CM | POA: Insufficient documentation

## 2022-08-03 DIAGNOSIS — I10 Essential (primary) hypertension: Secondary | ICD-10-CM | POA: Insufficient documentation

## 2022-08-03 DIAGNOSIS — K449 Diaphragmatic hernia without obstruction or gangrene: Secondary | ICD-10-CM | POA: Diagnosis not present

## 2022-08-03 DIAGNOSIS — M79602 Pain in left arm: Secondary | ICD-10-CM | POA: Diagnosis not present

## 2022-08-03 DIAGNOSIS — R202 Paresthesia of skin: Secondary | ICD-10-CM | POA: Insufficient documentation

## 2022-08-03 LAB — BASIC METABOLIC PANEL
Anion gap: 6 (ref 5–15)
BUN: 18 mg/dL (ref 6–20)
CO2: 32 mmol/L (ref 22–32)
Calcium: 9.1 mg/dL (ref 8.9–10.3)
Chloride: 101 mmol/L (ref 98–111)
Creatinine, Ser: 0.66 mg/dL (ref 0.44–1.00)
GFR, Estimated: >60 mL/min (ref >60)
Glucose, Bld: 121 mg/dL — ABNORMAL HIGH (ref 70–99)
Potassium: 4.6 mmol/L (ref 3.5–5.1)
Sodium: 139 mmol/L (ref 135–145)

## 2022-08-03 LAB — CBC
HCT: 42.2 % (ref 36.0–46.0)
Hemoglobin: 13.4 g/dL (ref 12.0–15.0)
MCH: 29 pg (ref 26.0–34.0)
MCHC: 31.8 g/dL (ref 30.0–36.0)
MCV: 91.3 fL (ref 80.0–100.0)
Platelets: 225 10*3/uL (ref 150–400)
RBC: 4.62 MIL/uL (ref 3.87–5.11)
RDW: 14.6 % (ref 11.5–15.5)
WBC: 7.9 10*3/uL (ref 4.0–10.5)
nRBC: 0 % (ref 0.0–0.2)

## 2022-08-03 LAB — TROPONIN I (HIGH SENSITIVITY)
Troponin I (High Sensitivity): 2 ng/L (ref <18)
Troponin I (High Sensitivity): 3 ng/L (ref <18)

## 2022-08-03 MED ORDER — IOHEXOL 350 MG/ML SOLN
100.0000 mL | Freq: Once | INTRAVENOUS | Status: AC | PRN
  Administered 2022-08-03: 100 mL via INTRAVENOUS

## 2022-08-03 NOTE — Discharge Instructions (Signed)
Please follow-up with your primary care doctor, and cardiology as advised before.  Return to the ER if you feel like your symptoms are worsening.

## 2022-08-03 NOTE — ED Triage Notes (Signed)
Pt arrives pov, steady gait with c/o LT side chest pressure and "tingling" in LT side fingers today. Tx for same on 7/10. ALso endorses LT arm pain

## 2022-08-03 NOTE — ED Provider Notes (Signed)
[place] EMERGENCY DEPARTMENT AT [facility] Provider Note   CSN: [id number] Arrival date & time: 08/03/22  1244     History  Chief Complaint  Patient presents with   Chest Pain    April Mccarty is a 57 y.o. female, history of tobacco use, hypertension, prediabetes, who presents to the ED secondary to central chest pain, radiating to the left arm, with associated paresthesias, lasted for about an hour.  She states the pain was an 8 out of 10, when it first occurred, and now it is a 6 out of 10.  She notes that it felt like her left arm was tingling, and she had chest pain like this on Wednesday and was told to follow-up with a cardiologist because her heart rate was low.  She notes that it came back, so she went to get checked out.  Has not had any syncopal episodes, shortness of breath, but does endorse some nausea with the chest pain.  Has not taken anything for the pain.  Has noticed that her heart rate has just been a little slow.  Notes that she has had a lot of anxiety at work, and that she has had some weight gain.    Home Medications Prior to Admission medications   Medication Sig Start Date End Date Taking? Authorizing Provider  albuterol (VENTOLIN HFA) 108 (90 Base) MCG/ACT inhaler Inhale into the lungs every 6 (six) hours as needed for wheezing or shortness of breath. coughing    [provider]  ALPRAZolam (XANAX) 0.25 MG tablet Take 0.125-0.25 mg by mouth 3 (three) times daily as needed for anxiety.    [provider]  busPIRone (BUSPAR) 10 MG tablet Take 5 mg by mouth daily. 02/22/22   [provider]  escitalopram (LEXAPRO) 20 MG tablet Take 20 mg by mouth daily. 03/05/22   [provider]  ibuprofen (ADVIL) 800 MG tablet Take 1 tablet (800 mg total) by mouth every 8 (eight) hours as needed for moderate pain, cramping or mild pain. 03/14/22   Steva Ready, DO  lisinopril (ZESTRIL) 20 MG tablet Take 20 mg by mouth daily.     [provider]  ondansetron (ZOFRAN) 8 MG tablet Take 8 mg by mouth every 8 (eight) hours as needed for nausea or vomiting.    [provider]  traMADol (ULTRAM) 50 MG tablet Take 50 mg by mouth every evening.    [provider]      Allergies    Hydrochlorothiazide, Sulfa antibiotics, and Tessalon [benzonatate]    Review of Systems   Review of Systems  Respiratory:  Negative for shortness of breath.   Cardiovascular:  Positive for chest pain.    Physical Exam Updated Vital Signs BP (!) 107/59   Pulse (!) 45   Resp 15   Wt 115.7 kg   LMP 02/28/2022 Comment: Urine pregnancy test= Negative 03/14/22  SpO2 99%   BMI 42.43 kg/m  Physical Exam Vitals and nursing note reviewed.  Constitutional:      General: She is not in acute distress.    Appearance: She is well-developed.  HENT:     Head: Normocephalic and atraumatic.  Eyes:     Conjunctiva/sclera: Conjunctivae normal.  Cardiovascular:     Rate and Rhythm: Normal rate and regular rhythm.     Heart sounds: No murmur heard. Pulmonary:     Effort: Pulmonary effort is normal. No respiratory distress.     Breath sounds: Normal breath sounds.  Abdominal:  Palpations: Abdomen is soft.     Tenderness: There is no abdominal tenderness.  Musculoskeletal:        General: No swelling.     Cervical back: Neck supple.  Skin:    General: Skin is warm and dry.     Capillary Refill: Capillary refill takes less than 2 seconds.  Neurological:     Mental Status: She is alert.  Psychiatric:        Mood and Affect: Mood normal.     ED Results / Procedures / Treatments   Labs (all labs ordered are listed, but only abnormal results are displayed) Labs Reviewed  BASIC METABOLIC PANEL - Abnormal; Notable for the following components:      Result Value   Glucose, Bld 121 (*)    All other components within normal limits  CBC  TROPONIN I (HIGH SENSITIVITY)  TROPONIN I (HIGH SENSITIVITY)     EKG None  Radiology CT Angio Chest/Abd/Pel for Dissection W and/or Wo Contrast  Result Date: 08/03/2022 CLINICAL DATA:  Chest and left arm pain. EXAM: CT ANGIOGRAPHY CHEST, ABDOMEN AND PELVIS TECHNIQUE: Non-contrast CT of the chest was initially obtained. Multidetector CT imaging through the chest, abdomen and pelvis was performed using the standard protocol during bolus administration of intravenous contrast. Multiplanar reconstructed images and MIPs were obtained and reviewed to evaluate the vascular anatomy. RADIATION DOSE REDUCTION: This exam was performed according to the departmental dose-optimization program which includes automated exposure control, adjustment of the mA and/or kV according to patient size and/or use of iterative reconstruction technique. CONTRAST:  OMNIPAQUE IOHEXOL 350 MG/ML SOLN COMPARISON:  July 15, 2012. FINDINGS: CTA CHEST FINDINGS Cardiovascular: Preferential opacification of the thoracic aorta. No evidence of thoracic aortic aneurysm or dissection. Normal heart size. No pericardial effusion. Aberrant right subclavian artery is noted which is congenital anomaly. Mediastinum/Nodes: [name] sliding-type hiatal hernia. Thyroid gland is unremarkable. No adenopathy is noted. Lungs/Pleura: Lungs are clear. No pleural effusion or pneumothorax. Musculoskeletal: No chest wall abnormality. No acute or significant osseous findings. Review of the MIP images confirms the above findings. CTA ABDOMEN AND PELVIS FINDINGS VASCULAR Aorta: Normal caliber aorta without aneurysm, dissection, vasculitis or significant stenosis. Celiac: Patent without evidence of aneurysm, dissection, vasculitis or significant stenosis. SMA: Patent without evidence of aneurysm, dissection, vasculitis or significant stenosis. Renals: Both renal arteries are patent without evidence of aneurysm, dissection, vasculitis, fibromuscular dysplasia or significant stenosis. IMA: Patent without evidence of aneurysm,  dissection, vasculitis or significant stenosis. Inflow: Patent without evidence of aneurysm, dissection, vasculitis or significant stenosis. Veins: No obvious venous abnormality within the limitations of this arterial phase study. Review of the MIP images confirms the above findings. NON-VASCULAR Hepatobiliary: No focal liver abnormality is seen. Status post cholecystectomy. No biliary dilatation. Pancreas: Unremarkable. No pancreatic ductal dilatation or surrounding inflammatory changes. Spleen: Normal in size without focal abnormality. Adrenals/Urinary Tract: Adrenal glands are unremarkable. Kidneys are normal, without renal calculi, focal lesion, or hydronephrosis. Bladder is unremarkable. Stomach/Bowel: Stomach is within normal limits. Appendix appears normal. No evidence of bowel wall thickening, distention, or inflammatory changes. Lymphatic: No adenopathy is noted. Reproductive: Uterus and bilateral adnexa are unremarkable. Other: No abdominal wall hernia or abnormality. No abdominopelvic ascites. Musculoskeletal: No acute or significant osseous findings. Review of the MIP images confirms the above findings. IMPRESSION: No evidence of thoracic or abdominal aortic dissection or aneurysm. Anjolina Byrer sliding-type hiatal hernia. No other abnormality seen in the chest, abdomen or pelvis. Electronically Signed   By: Lupita Raider  M.D.   On: 08/03/2022 15:25   DG Chest 2 View  Result Date: 08/03/2022 CLINICAL DATA:  Left-sided chest pressure EXAM: CHEST - 2 VIEW COMPARISON:  Chest radiograph dated 08/01/2022 FINDINGS: Normal lung volumes. No focal consolidations. No pleural effusion or pneumothorax. The heart size and mediastinal contours are within normal limits. No acute osseous abnormality. IMPRESSION: No active cardiopulmonary disease. Electronically Signed   By: Agustin Cree M.D.   On: 08/03/2022 13:36    Procedures Procedures    Medications Ordered in ED Medications  iohexol (OMNIPAQUE) 350 MG/ML  injection 100 mL (100 mLs Intravenous Contrast Given 08/03/22 1400)    ED Course/ Medical Decision Making/ A&P                             Medical Decision Making Patient is a 57 year old female, here for chest pain, that is squeezing, and that goes down her left arm, that started this morning.  She states that happened while she was at work.  Denies any kind of injury, does have some associated nausea, but no vomiting.  No shortness of breath.  Recently was seen 2 days prior for the same episode.  Does state that she has had more stress at work.  Given her neurosymptoms, chest pain, and persistent we will obtain a CTA dissection study for further evaluation.  Additionally obtain troponins  Amount and/or Complexity of Data Reviewed Labs: ordered.    Details: Troponins within normal limits, blood work within normal limits Radiology: ordered.    Details: CTA shows no type of dissection or aortic dilation Discussion of management or test interpretation with external provider(s): Discussed with patient, CTA of reassuring, no evidence of any dissection or dilation, troponins are within normal limits, EKG reassuring.  She is having bradycardia, I will need her to follow-up with cardiology in regards to this, she has already been referred.  Additionally she states that she thinks it is because her son and daughter-in-law are separating and she is having to take care of the care of kids much more.  She still says that she feels like there is a lot of pressure on her.  We discussed talking to therapist, follow-up with primary care doctor, cardiology for bradycardia.  She voiced understanding was well-appearing at discharge.  Risk Prescription drug management.   Final Clinical Impression(s) / ED Diagnoses Final diagnoses:  Chest pain, unspecified type    Rx / DC Orders ED Discharge Orders     None         Tish Begin, Harley Alto, PA 08/03/22 1642    Tanda Rockers A, DO 08/05/22 [phone]

## 2022-08-03 NOTE — ED Notes (Signed)
Discharge instructions and follow up care reviewed and explained. Pt verbalized understanding and had no further questions on d/c.  

## 2022-08-03 NOTE — ED Notes (Signed)
Pt verbalized understanding of d/c instructions, meds, and followup care. Denies questions. VSS, no distress noted. Steady gait to exit with all belongings.  ?

## 2022-08-09 DIAGNOSIS — F419 Anxiety disorder, unspecified: Secondary | ICD-10-CM | POA: Diagnosis not present

## 2022-08-09 DIAGNOSIS — I10 Essential (primary) hypertension: Secondary | ICD-10-CM | POA: Diagnosis not present

## 2022-08-09 DIAGNOSIS — Z23 Encounter for immunization: Secondary | ICD-10-CM | POA: Diagnosis not present

## 2022-08-09 DIAGNOSIS — G4733 Obstructive sleep apnea (adult) (pediatric): Secondary | ICD-10-CM | POA: Diagnosis not present

## 2022-08-09 DIAGNOSIS — Z0000 Encounter for general adult medical examination without abnormal findings: Secondary | ICD-10-CM | POA: Diagnosis not present

## 2022-08-09 DIAGNOSIS — F17200 Nicotine dependence, unspecified, uncomplicated: Secondary | ICD-10-CM | POA: Diagnosis not present

## 2022-08-09 DIAGNOSIS — R7303 Prediabetes: Secondary | ICD-10-CM | POA: Diagnosis not present

## 2022-08-30 ENCOUNTER — Ambulatory Visit: Payer: 59

## 2022-09-04 ENCOUNTER — Encounter: Payer: Self-pay | Admitting: Internal Medicine

## 2022-09-04 ENCOUNTER — Ambulatory Visit: Payer: 59 | Admitting: Internal Medicine

## 2022-09-04 VITALS — BP 118/70 | HR 63 | Ht 65.0 in | Wt 255.6 lb

## 2022-09-04 DIAGNOSIS — R001 Bradycardia, unspecified: Secondary | ICD-10-CM

## 2022-09-04 DIAGNOSIS — G4733 Obstructive sleep apnea (adult) (pediatric): Secondary | ICD-10-CM | POA: Insufficient documentation

## 2022-09-04 DIAGNOSIS — R079 Chest pain, unspecified: Secondary | ICD-10-CM

## 2022-09-04 DIAGNOSIS — R0789 Other chest pain: Secondary | ICD-10-CM

## 2022-09-04 DIAGNOSIS — Q278 Other specified congenital malformations of peripheral vascular system: Secondary | ICD-10-CM | POA: Insufficient documentation

## 2022-09-04 MED ORDER — METOPROLOL TARTRATE 25 MG PO TABS: ORAL_TABLET | ORAL | 0 refills | Status: AC

## 2022-09-04 NOTE — Progress Notes (Signed)
Cardiology Office Note  Date: 09/04/2022   ID: Navee Percy, DOB 1965/05/09, MRN [id number]  PCP:  Noberto Retort, MD  Cardiologist:  Marjo Bicker, MD Electrophysiologist:  None    History of Present Illness: April Mccarty is a 57 y.o. female known to have HTN, prediabetes, nicotine abuse was referred to cardiology clinic post ER visit for chest pain.  Patient started to have substernal chest pain radiating to her jaw and left arm x 2 months, frequency at least once per week, occurs with rest and sometimes with exertion and resolve spontaneously.  She had ER visit in 07/2022 for similar episode of chest pain that occurred at work and associated with dizziness, nausea etc. Troponins within normal limits, EKG showed NSR with no new ischemia. Does not have DOE, orthopnea, PND, leg swelling, syncope. She had dizziness only during the ER visit in 7/24 but otherwise no recurrence of dizziness.  Her maternal uncles, 2 of them, had PCI in their 46s.  Patient's mother did not have CAD but has severe LVH, aortic regurgitation.  Past Medical History:  Diagnosis Date   Anxiety    Asthma    Back pain    Complication of anesthesia    Depression    Dysrhythmia    Environmental allergies    Hypertension    PONV (postoperative nausea and vomiting)    Pre-diabetes    Sleep apnea    cpap - does not know settings    URI (upper respiratory infection)     Past Surgical History:  Procedure Laterality Date   CHOLECYSTECTOMY  2008   DILATATION & CURETTAGE/HYSTEROSCOPY WITH MYOSURE N/A 03/14/2022   Procedure: DILATATION & CURETTAGE/HYSTEROSCOPY WITH MYOSURE;  Surgeon: Steva Ready, DO;  Location: [facility] OR;  Service: Gynecology;  Laterality: N/A;   TONSILLECTOMY  1984   TUBAL LIGATION     VENTRAL HERNIA REPAIR N/A 05/20/2012   Procedure: LAPAROSCOPIC VENTRAL HERNIA;  Surgeon: Kandis Cocking, MD;  Location: [facility] ORS;  Service: General;  Laterality: N/A;    Current Outpatient  Medications  Medication Sig Dispense Refill   albuterol (VENTOLIN HFA) 108 (90 Base) MCG/ACT inhaler Inhale into the lungs every 6 (six) hours as needed for wheezing or shortness of breath. coughing     ALPRAZolam (XANAX) 0.25 MG tablet Take 0.125-0.25 mg by mouth 3 (three) times daily as needed for anxiety.     busPIRone (BUSPAR) 10 MG tablet Take 15 mg by mouth daily.     escitalopram (LEXAPRO) 20 MG tablet Take 20 mg by mouth daily.     ibuprofen (ADVIL) 800 MG tablet Take 1 tablet (800 mg total) by mouth every 8 (eight) hours as needed for moderate pain, cramping or mild pain. 30 tablet 1   lisinopril (ZESTRIL) 20 MG tablet Take 20 mg by mouth daily.     oxybutynin (DITROPAN-XL) 5 MG 24 hr tablet Take 5 mg by mouth daily.     traMADol (ULTRAM) 50 MG tablet Take 50 mg by mouth every evening.     No current facility-administered medications for this visit.   Allergies:  Hydrochlorothiazide, Sulfa antibiotics, and Tessalon [benzonatate]   Social History: The patient  reports that she has been smoking cigarettes. She has never used smokeless tobacco. She reports that she does not drink alcohol and does not use drugs.   Family History: The patient's family history includes Diabetes in her mother.   ROS:  Please see the history of present illness. Otherwise, complete review of systems is  positive for none.  All other systems are reviewed and negative.   Physical Exam: VS:  Pulse 63   Ht 5\' 5"  (1.651 m)   Wt 255 lb 9.6 oz (115.9 kg)   LMP 02/28/2022 Comment: Urine pregnancy test= Negative 03/14/22  SpO2 94%   BMI 42.53 kg/m , BMI Body mass index is 42.53 kg/m.  Wt Readings from Last 3 Encounters:  09/04/22 255 lb 9.6 oz (115.9 kg)  08/03/22 255 lb (115.7 kg)  08/01/22 256 lb (116.1 kg)    General: Patient appears comfortable at rest. HEENT: Conjunctiva and lids normal, oropharynx clear with moist mucosa. Neck: Supple, no elevated JVP or carotid bruits, no thyromegaly. Lungs: Clear  to auscultation, nonlabored breathing at rest. Cardiac: Regular rate and rhythm, no S3 or significant systolic murmur, no pericardial rub. Abdomen: Soft, nontender, no hepatomegaly, bowel sounds present, no guarding or rebound. Extremities: No pitting edema, distal pulses 2+. Skin: Warm and dry. Musculoskeletal: No kyphosis. Neuropsychiatric: Alert and oriented x3, affect grossly appropriate.  Recent Labwork: 08/01/2022: TSH 1.611 08/03/2022: BUN 18; Creatinine, Ser 0.66; Hemoglobin 13.4; Platelets 225; Potassium 4.6; Sodium 139     Component Value Date/Time   CHOL  12/09/2007 1415    167        ATP III CLASSIFICATION:  <200     mg/dL   Desirable  660-630  mg/dL   Borderline High  >=160    mg/dL   High   TRIG 48 10/93/2355 1415   HDL 52 12/09/2007 1415   CHOLHDL 3.2 12/09/2007 1415   VLDL 10 12/09/2007 1415   LDLCALC (H) 12/09/2007 1415    105        Total Cholesterol/HDL:CHD Risk Coronary Heart Disease Risk Table                     Men   Women  1/2 Average Risk   3.4   3.3     Assessment and Plan:  Atypical chest pain likely secondary to hiatal hernia, rule out CAD due to family history of CAD: Troponins within normal limits and EKG showed no ischemia. Obtain CT cardiac and 2D echocardiogram  Aberrant right subclavian artery: Congenital anomaly, no further intervention.  Sinus bradycardia, asymptomatic: HR 40 to 50s, usually runs around 50s.  Diagnosed with OSA but does not use CPAP.  Increase CPAP compliance.  OSA not on CPAP: Encourage CPAP compliance, not on CPAP currently.   Medication Adjustments/Labs and Tests Ordered: Current medicines are reviewed at length with the patient today.  Concerns regarding medicines are outlined above.    Disposition:  Follow up pending results  Signed, Ayelet Gruenewald Verne Spurr, MD, 09/04/2022 3:59 PM    [facility] [address], [place], [place] [address]

## 2022-09-04 NOTE — Patient Instructions (Addendum)
Medication Instructions:  Your physician recommends that you continue on your current medications as directed. Please refer to the Current Medication list given to you today.  *If you need a refill on your cardiac medications before your next appointment, please call your pharmacy*   Lab Work: BMET  If you have labs (blood work) drawn today and your tests are completely normal, you will receive your results only by: MyChart Message (if you have MyChart) OR A paper copy in the mail If you have any lab test that is abnormal or we need to change your treatment, we will call you to review the results.   Testing/Procedures: Your physician has requested that you have an echocardiogram. Echocardiography is a painless test that uses sound waves to create images of your heart. It provides your doctor with information about the size and shape of your heart and how well your heart's chambers and valves are working. This procedure takes approximately one hour. There are no restrictions for this procedure. Please do NOT wear cologne, perfume, aftershave, or lotions (deodorant is allowed). Please arrive 15 minutes prior to your appointment time.  Coronary CTA  Follow-Up: At [facility], you and your health needs are our priority.  As part of our continuing mission to provide you with exceptional heart care, we have created designated Provider Care Teams.  These Care Teams include your primary Cardiologist (physician) and Advanced Practice Providers (APPs -  Physician Assistants and Nurse Practitioners) who all work together to provide you with the care you need, when you need it.  We recommend signing up for the patient portal called "MyChart".  Sign up information is provided on this After Visit Summary.  MyChart is used to connect with patients for Virtual Visits (Telemedicine).  Patients are able to view lab/test results, encounter notes, upcoming appointments, etc.  Non-urgent messages can  be sent to your provider as well.   To learn more about what you can do with MyChart, go to [email or web address].    Your next appointment:    Follow up- Pending Testing Results  Provider:   Luane School, MD    Other Instructions   Your cardiac CT will be scheduled at one of the below locations:   [facility] [address] [place], [place] [address] [phone]  If scheduled at [facility], please arrive at the [facility] and Children's Entrance (Entrance C2) of [facility] 30 minutes prior to test start time. You can use the FREE valet parking offered at entrance C (encouraged to control the heart rate for the test)  Proceed to the [facility] Radiology Department (first floor) to check-in and test prep.  All radiology patients and guests should use entrance C2 at [facility], accessed from [facility], even though the hospital's physical address listed is [address].    Please follow these instructions carefully (unless otherwise directed):  On the Night Before the Test: Be sure to Drink plenty of water. Do not consume any caffeinated/decaffeinated beverages or chocolate 12 hours prior to your test. Do not take any antihistamines 12 hours prior to your test.  On the Day of the Test: Drink plenty of water until 1 hour prior to the test. Do not eat any food 1 hour prior to test. You may take your regular medications prior to the test.  Take metoprolol (Lopressor) two hours prior to test. If you take Furosemide/Hydrochlorothiazide/Spironolactone, please HOLD on the morning of the test. FEMALES-  please wear underwire-free bra if available, avoid dresses & tight clothing      After the Test: Drink plenty of water. After receiving IV contrast, you may experience a mild flushed feeling. This is normal. On occasion, you may experience a mild rash up to 24 hours after the test. This is not dangerous. If this  occurs, you can take Benadryl 25 mg and increase your fluid intake. If you experience trouble breathing, this can be serious. If it is severe call 911 IMMEDIATELY. If it is mild, please call our office. If you take any of these medications: Glipizide/Metformin, Avandament, Glucavance, please do not take 48 hours after completing test unless otherwise instructed.  We will call to schedule your test 2-4 weeks out understanding that some insurance companies will need an authorization prior to the service being performed.   For more information and frequently asked questions, please visit our website : [email or web address]  For non-scheduling related questions, please contact the cardiac imaging nurse navigator should you have any questions/concerns: Cardiac Imaging Nurse Navigators Direct Office Dial: [phone]   For scheduling needs, including cancellations and rescheduling, please call [place], [phone].

## 2022-09-05 ENCOUNTER — Telehealth: Payer: Self-pay | Admitting: Internal Medicine

## 2022-09-05 NOTE — Telephone Encounter (Signed)
Per Note: Atypical chest pain likely secondary to hiatal hernia, rule out CAD due to family history of CAD: Troponins within normal limits and EKG showed no ischemia. Obtain CT cardiac and 2D echocardiogram    Please advise. Pt had CT Angio Chest/ABD/PEL Dissection w wo contrast completed on 08/03/22.

## 2022-09-05 NOTE — Telephone Encounter (Signed)
Pt notified and thankful for the call

## 2022-09-05 NOTE — Telephone Encounter (Signed)
New Message:       Patient wanted to find out from Dr Jenene Slicker if she needs another CT? She said she just had one on 08-03-22.

## 2022-09-06 ENCOUNTER — Encounter (HOSPITAL_COMMUNITY): Payer: Self-pay

## 2022-09-10 ENCOUNTER — Telehealth (HOSPITAL_COMMUNITY): Payer: Self-pay | Admitting: *Deleted

## 2022-09-10 NOTE — Telephone Encounter (Signed)
Reaching out to patient to offer assistance regarding upcoming cardiac imaging study; pt verbalizes understanding of appt date/time, parking situation and where to check in, pre-test NPO status and medications ordered, and verified current allergies; name and call back number provided for further questions should they arise Hayley Sharpe RN Navigator Cardiac Imaging [facility] [phone] office [phone] cell  

## 2022-09-11 ENCOUNTER — Ambulatory Visit (HOSPITAL_COMMUNITY)
Admission: RE | Admit: 2022-09-11 | Discharge: 2022-09-11 | Disposition: A | Discharge status: Home / Self Care | Payer: 59 | Source: Ambulatory Visit | Attending: Internal Medicine | Admitting: Internal Medicine

## 2022-09-11 DIAGNOSIS — R072 Precordial pain: Secondary | ICD-10-CM

## 2022-09-11 DIAGNOSIS — R079 Chest pain, unspecified: Secondary | ICD-10-CM | POA: Diagnosis not present

## 2022-09-11 MED ORDER — IOHEXOL 350 MG/ML SOLN
100.0000 mL | Freq: Once | INTRAVENOUS | Status: AC | PRN
  Administered 2022-09-11: 100 mL via INTRAVENOUS

## 2022-09-11 MED ORDER — NITROGLYCERIN 0.4 MG SL SUBL
0.8000 mg | SUBLINGUAL_TABLET | Freq: Once | SUBLINGUAL | Status: AC
  Administered 2022-09-11: 0.8 mg via SUBLINGUAL

## 2022-09-11 MED ORDER — NITROGLYCERIN 0.4 MG SL SUBL
SUBLINGUAL_TABLET | SUBLINGUAL | Status: AC
  Filled 2022-09-11: qty 2

## 2022-09-13 DIAGNOSIS — H5213 Myopia, bilateral: Secondary | ICD-10-CM | POA: Diagnosis not present

## 2022-09-13 DIAGNOSIS — H52203 Unspecified astigmatism, bilateral: Secondary | ICD-10-CM | POA: Diagnosis not present

## 2022-09-13 DIAGNOSIS — D23121 Other benign neoplasm of skin of left upper eyelid, including canthus: Secondary | ICD-10-CM | POA: Diagnosis not present

## 2022-10-06 ENCOUNTER — Ambulatory Visit (HOSPITAL_COMMUNITY)
Admission: RE | Admit: 2022-10-06 | Discharge: 2022-10-06 | Disposition: A | Discharge status: Home / Self Care | Payer: 59 | Source: Ambulatory Visit | Attending: Internal Medicine | Admitting: Internal Medicine

## 2022-10-06 DIAGNOSIS — R079 Chest pain, unspecified: Secondary | ICD-10-CM | POA: Diagnosis not present

## 2022-10-06 LAB — ECHOCARDIOGRAM COMPLETE
AR max vel: 2.96 cm2
AV Area VTI: 2.49 cm2
AV Area mean vel: 2.81 cm2
AV Mean grad: 3 mmHg
AV Peak grad: 5.6 mmHg
Ao pk vel: 1.18 m/s
Area-P 1/2: 2.78 cm2
MV VTI: 2.85 cm2
S' Lateral: 3.3 cm

## 2022-10-06 NOTE — Progress Notes (Signed)
*  PRELIMINARY RESULTS* Echocardiogram 2D Echocardiogram has been performed.  Carolyne Fiscal 10/06/2022, 12:13 PM

## 2022-10-15 DIAGNOSIS — B079 Viral wart, unspecified: Secondary | ICD-10-CM | POA: Diagnosis not present

## 2022-11-08 ENCOUNTER — Ambulatory Visit: Payer: 59 | Admitting: Internal Medicine

## 2022-11-20 DIAGNOSIS — Z6841 Body Mass Index (BMI) 40.0 and over, adult: Secondary | ICD-10-CM | POA: Diagnosis not present

## 2022-11-20 DIAGNOSIS — J4521 Mild intermittent asthma with (acute) exacerbation: Secondary | ICD-10-CM | POA: Diagnosis not present

## 2023-01-18 DIAGNOSIS — J069 Acute upper respiratory infection, unspecified: Secondary | ICD-10-CM | POA: Diagnosis not present

## 2023-01-18 DIAGNOSIS — Z6841 Body Mass Index (BMI) 40.0 and over, adult: Secondary | ICD-10-CM | POA: Diagnosis not present

## 2023-01-22 DIAGNOSIS — R053 Chronic cough: Secondary | ICD-10-CM | POA: Diagnosis not present

## 2023-04-15 DIAGNOSIS — J069 Acute upper respiratory infection, unspecified: Secondary | ICD-10-CM | POA: Diagnosis not present

## 2023-04-15 DIAGNOSIS — J029 Acute pharyngitis, unspecified: Secondary | ICD-10-CM | POA: Diagnosis not present

## 2023-06-06 DIAGNOSIS — Z1231 Encounter for screening mammogram for malignant neoplasm of breast: Secondary | ICD-10-CM | POA: Diagnosis not present

## 2023-06-25 DIAGNOSIS — Z01419 Encounter for gynecological examination (general) (routine) without abnormal findings: Secondary | ICD-10-CM | POA: Diagnosis not present

## 2023-08-20 DIAGNOSIS — G4733 Obstructive sleep apnea (adult) (pediatric): Secondary | ICD-10-CM | POA: Diagnosis not present

## 2023-08-20 DIAGNOSIS — I10 Essential (primary) hypertension: Secondary | ICD-10-CM | POA: Diagnosis not present

## 2023-08-20 DIAGNOSIS — Z6841 Body Mass Index (BMI) 40.0 and over, adult: Secondary | ICD-10-CM | POA: Diagnosis not present

## 2023-08-20 DIAGNOSIS — E7800 Pure hypercholesterolemia, unspecified: Secondary | ICD-10-CM | POA: Diagnosis not present

## 2023-08-20 DIAGNOSIS — E119 Type 2 diabetes mellitus without complications: Secondary | ICD-10-CM | POA: Diagnosis not present

## 2023-09-21 DIAGNOSIS — M25561 Pain in right knee: Secondary | ICD-10-CM | POA: Diagnosis not present

## 2023-09-26 DIAGNOSIS — F50819 Binge eating disorder, unspecified: Secondary | ICD-10-CM | POA: Diagnosis not present

## 2023-09-26 DIAGNOSIS — E7800 Pure hypercholesterolemia, unspecified: Secondary | ICD-10-CM | POA: Diagnosis not present

## 2023-09-26 DIAGNOSIS — E119 Type 2 diabetes mellitus without complications: Secondary | ICD-10-CM | POA: Diagnosis not present

## 2023-09-26 DIAGNOSIS — E66813 Obesity, class 3: Secondary | ICD-10-CM | POA: Diagnosis not present

## 2023-09-26 DIAGNOSIS — K219 Gastro-esophageal reflux disease without esophagitis: Secondary | ICD-10-CM | POA: Diagnosis not present

## 2023-09-26 DIAGNOSIS — E559 Vitamin D deficiency, unspecified: Secondary | ICD-10-CM | POA: Diagnosis not present

## 2023-09-26 DIAGNOSIS — I10 Essential (primary) hypertension: Secondary | ICD-10-CM | POA: Diagnosis not present

## 2023-09-26 DIAGNOSIS — F419 Anxiety disorder, unspecified: Secondary | ICD-10-CM | POA: Diagnosis not present

## 2023-09-26 DIAGNOSIS — Z6841 Body Mass Index (BMI) 40.0 and over, adult: Secondary | ICD-10-CM | POA: Diagnosis not present

## 2023-09-26 DIAGNOSIS — G4733 Obstructive sleep apnea (adult) (pediatric): Secondary | ICD-10-CM | POA: Diagnosis not present

## 2023-10-10 DIAGNOSIS — E559 Vitamin D deficiency, unspecified: Secondary | ICD-10-CM | POA: Diagnosis not present

## 2023-10-10 DIAGNOSIS — E66813 Obesity, class 3: Secondary | ICD-10-CM | POA: Diagnosis not present

## 2023-10-10 DIAGNOSIS — I10 Essential (primary) hypertension: Secondary | ICD-10-CM | POA: Diagnosis not present

## 2023-10-10 DIAGNOSIS — E7800 Pure hypercholesterolemia, unspecified: Secondary | ICD-10-CM | POA: Diagnosis not present

## 2023-10-10 DIAGNOSIS — F50819 Binge eating disorder, unspecified: Secondary | ICD-10-CM | POA: Diagnosis not present

## 2023-10-10 DIAGNOSIS — E119 Type 2 diabetes mellitus without complications: Secondary | ICD-10-CM | POA: Diagnosis not present

## 2023-10-10 DIAGNOSIS — G4733 Obstructive sleep apnea (adult) (pediatric): Secondary | ICD-10-CM | POA: Diagnosis not present

## 2023-10-10 DIAGNOSIS — Z6841 Body Mass Index (BMI) 40.0 and over, adult: Secondary | ICD-10-CM | POA: Diagnosis not present

## 2023-10-10 DIAGNOSIS — K219 Gastro-esophageal reflux disease without esophagitis: Secondary | ICD-10-CM | POA: Diagnosis not present

## 2023-10-10 DIAGNOSIS — F419 Anxiety disorder, unspecified: Secondary | ICD-10-CM | POA: Diagnosis not present

## 2023-10-17 DIAGNOSIS — N950 Postmenopausal bleeding: Secondary | ICD-10-CM | POA: Diagnosis not present

## 2023-10-17 DIAGNOSIS — N898 Other specified noninflammatory disorders of vagina: Secondary | ICD-10-CM | POA: Diagnosis not present

## 2023-10-23 DIAGNOSIS — J069 Acute upper respiratory infection, unspecified: Secondary | ICD-10-CM | POA: Diagnosis not present

## 2023-11-05 DIAGNOSIS — N950 Postmenopausal bleeding: Secondary | ICD-10-CM | POA: Diagnosis not present

## 2023-11-05 DIAGNOSIS — D259 Leiomyoma of uterus, unspecified: Secondary | ICD-10-CM | POA: Diagnosis not present

## 2023-11-14 DIAGNOSIS — E669 Obesity, unspecified: Secondary | ICD-10-CM | POA: Diagnosis not present

## 2023-11-14 DIAGNOSIS — G4733 Obstructive sleep apnea (adult) (pediatric): Secondary | ICD-10-CM | POA: Diagnosis not present

## 2023-11-20 DIAGNOSIS — E66813 Obesity, class 3: Secondary | ICD-10-CM | POA: Diagnosis not present

## 2023-11-20 DIAGNOSIS — F419 Anxiety disorder, unspecified: Secondary | ICD-10-CM | POA: Diagnosis not present

## 2023-11-20 DIAGNOSIS — K219 Gastro-esophageal reflux disease without esophagitis: Secondary | ICD-10-CM | POA: Diagnosis not present

## 2023-11-20 DIAGNOSIS — F50819 Binge eating disorder, unspecified: Secondary | ICD-10-CM | POA: Diagnosis not present

## 2023-11-20 DIAGNOSIS — E7800 Pure hypercholesterolemia, unspecified: Secondary | ICD-10-CM | POA: Diagnosis not present

## 2023-11-20 DIAGNOSIS — G4733 Obstructive sleep apnea (adult) (pediatric): Secondary | ICD-10-CM | POA: Diagnosis not present

## 2023-11-20 DIAGNOSIS — I10 Essential (primary) hypertension: Secondary | ICD-10-CM | POA: Diagnosis not present

## 2023-11-20 DIAGNOSIS — E559 Vitamin D deficiency, unspecified: Secondary | ICD-10-CM | POA: Diagnosis not present

## 2023-11-20 DIAGNOSIS — Z6841 Body Mass Index (BMI) 40.0 and over, adult: Secondary | ICD-10-CM | POA: Diagnosis not present

## 2023-11-20 DIAGNOSIS — E119 Type 2 diabetes mellitus without complications: Secondary | ICD-10-CM | POA: Diagnosis not present

## 2023-12-31 DIAGNOSIS — N950 Postmenopausal bleeding: Secondary | ICD-10-CM | POA: Diagnosis not present

## 2023-12-31 DIAGNOSIS — R9389 Abnormal findings on diagnostic imaging of other specified body structures: Secondary | ICD-10-CM | POA: Diagnosis not present

## 2024-02-05 ENCOUNTER — Other Ambulatory Visit: Payer: Self-pay | Admitting: Obstetrics and Gynecology

## 2024-02-26 NOTE — H&P (Incomplete)
 April Mccarty is a 59 y.o. female No obstetric history on file. who presents for   Past Medical History  OB History: G:2;  P: 2-0-0-2  GYN History: Menopausal;   Denies history of abnormal PAP smear. Last PAP smear:  2023-normal  Medical History: Diabetes Mellitus, Depression, Anxiety, Hypertension, Obstructive Sleep Apnea, Overactive Bladder, Dyslipidemia and Asthma  Surgical History: Tonsillectomy, Cholecystectomy, Tubal Sterilization, Umbilical Hernia Repair and Hysteroscopic Polypectomy   Family History: Hyperlipidemia, Diabetes Mellitus, Myocardial Infarction and Stroke  Social History:  Married and works in [employer], [employer]; Former smoker and no alcohol use.   Medications:  Buspirone 10 mg 1.5 tablets daily Escitalopram  20 mg daily Famotidine 20 mg daily Lisinopril  20 mg daily Oxybutinin Chloride 5 mg daily Phentermine 37.5 mg  1/2 tablet daily Zyrtec 10 mg daily  Allergies[1]  ROS: Denies headache, vision changes, nasal congestion, dysphagia, tinnitus, dizziness, hoarseness, cough,  chest pain, shortness of breath, nausea, vomiting, diarrhea,constipation,  urinary frequency, urgency  dysuria, hematuria, vaginitis symptoms, pelvic pain, swelling of joints,easy bruising,  myalgias, arthralgias, skin rashes, unexplained weight loss and except as is mentioned in the history of present illness, patient's review of systems is otherwise negative.  Physical Exam  Bp: 126/88;  Weight: 260.8 lbs;  Height: 5'5;  BMI: 43.4  Neck: supple without masses or thyromegaly Lungs: clear to auscultation Heart: regular rate and rhythm Abdomen: soft, non-tender and no organomegaly Pelvic:EGBUS- wnl; vagina-normal rugae; uterus-normal size, cervix without lesions or motion tenderness; adnexae-no tenderness or masses Extremities:  no clubbing, cyanosis or edema   Assesment: Thickened Endometrium            Post Menopausal Bleeding  Disposition:  A discussion was held with  patient regarding the indication for her procedure(s) along with the risks, which include but are not limited to: reaction to anesthesia, damage to adjacent organs, infection and excessive bleeding. The patient verbalized understanding of these risks and has consented to proceed with a Hysteroscopy, Dilatation and Curettage at [facility] on April 02, 2024.   CSN# [id number]   Xachary Hambly J. Perri, PA-C  for Dr. Nena LABOR. [place]     [1]  Allergies Allergen Reactions   Hydrochlorothiazide  Swelling and Other (See Comments)    Lip tingling   Sulfa Antibiotics Other (See Comments)    unknown   Tessalon [Benzonatate] Cough

## 2024-04-23 ENCOUNTER — Ambulatory Visit (HOSPITAL_COMMUNITY): Admit: 2024-04-23 | Admitting: Obstetrics and Gynecology
